# Patient Record
Sex: Female | Born: 1980 | Race: White | Hispanic: No | Marital: Married | Smoking: Former smoker
Health system: Southern US, Community
[De-identification: ages and names within clinical notes are randomized; demographics above are authoritative.]

## PROBLEM LIST (undated history)

## (undated) DIAGNOSIS — Z87442 Personal history of urinary calculi: Secondary | ICD-10-CM

## (undated) DIAGNOSIS — Z9889 Other specified postprocedural states: Secondary | ICD-10-CM

## (undated) DIAGNOSIS — E039 Hypothyroidism, unspecified: Secondary | ICD-10-CM

## (undated) DIAGNOSIS — F988 Other specified behavioral and emotional disorders with onset usually occurring in childhood and adolescence: Secondary | ICD-10-CM

## (undated) DIAGNOSIS — R112 Nausea with vomiting, unspecified: Secondary | ICD-10-CM

## (undated) HISTORY — PX: TONSILLECTOMY: SUR1361

## (undated) HISTORY — PX: OTHER SURGICAL HISTORY: SHX169

---

## 2001-02-26 ENCOUNTER — Other Ambulatory Visit: Admission: RE | Admit: 2001-02-26 | Discharge: 2001-02-26 | Payer: Self-pay | Admitting: Obstetrics and Gynecology

## 2003-05-23 ENCOUNTER — Other Ambulatory Visit: Admission: RE | Admit: 2003-05-23 | Discharge: 2003-05-23 | Payer: Self-pay | Admitting: Obstetrics and Gynecology

## 2004-05-24 ENCOUNTER — Other Ambulatory Visit: Admission: RE | Admit: 2004-05-24 | Discharge: 2004-05-24 | Payer: Self-pay | Admitting: Obstetrics and Gynecology

## 2004-08-13 ENCOUNTER — Ambulatory Visit (HOSPITAL_COMMUNITY): Admission: RE | Admit: 2004-08-13 | Discharge: 2004-08-13 | Payer: Self-pay | Admitting: Internal Medicine

## 2005-02-22 ENCOUNTER — Inpatient Hospital Stay (HOSPITAL_COMMUNITY): Admission: AD | Admit: 2005-02-22 | Discharge: 2005-02-24 | Payer: Self-pay | Admitting: Obstetrics and Gynecology

## 2006-12-12 ENCOUNTER — Inpatient Hospital Stay (HOSPITAL_COMMUNITY): Admission: AD | Admit: 2006-12-12 | Discharge: 2006-12-12 | Payer: Self-pay | Admitting: Obstetrics and Gynecology

## 2007-05-02 ENCOUNTER — Inpatient Hospital Stay (HOSPITAL_COMMUNITY): Admission: AD | Admit: 2007-05-02 | Discharge: 2007-05-02 | Payer: Self-pay | Admitting: Obstetrics and Gynecology

## 2007-07-06 ENCOUNTER — Observation Stay (HOSPITAL_COMMUNITY): Admission: AD | Admit: 2007-07-06 | Discharge: 2007-07-08 | Payer: Self-pay | Admitting: Obstetrics and Gynecology

## 2007-07-06 ENCOUNTER — Inpatient Hospital Stay (HOSPITAL_COMMUNITY): Admission: AD | Admit: 2007-07-06 | Discharge: 2007-07-06 | Payer: Self-pay | Admitting: Obstetrics and Gynecology

## 2007-07-07 ENCOUNTER — Encounter: Payer: Self-pay | Admitting: Obstetrics & Gynecology

## 2007-07-29 ENCOUNTER — Inpatient Hospital Stay (HOSPITAL_COMMUNITY): Admission: AD | Admit: 2007-07-29 | Discharge: 2007-07-31 | Payer: Self-pay | Admitting: Obstetrics and Gynecology

## 2008-02-25 ENCOUNTER — Encounter: Admission: RE | Admit: 2008-02-25 | Discharge: 2008-02-25 | Payer: Self-pay | Admitting: Family Medicine

## 2008-10-21 ENCOUNTER — Ambulatory Visit (HOSPITAL_COMMUNITY): Admission: RE | Admit: 2008-10-21 | Discharge: 2008-10-21 | Payer: Self-pay | Admitting: Internal Medicine

## 2009-09-13 ENCOUNTER — Encounter: Admission: RE | Admit: 2009-09-13 | Discharge: 2009-09-13 | Payer: Self-pay | Admitting: Internal Medicine

## 2009-10-24 IMAGING — US US PELVIS COMPLETE
1 series · 14 of 25 positions shown · non-contrast
Comparison: none

OBSTETRICAL ULTRASOUND:

 This ultrasound exam was performed in the [HOSPITAL] Ultrasound Department.  The OB US report was generated in the AS system, and faxed to the ordering physician.  This report is also available in [REDACTED] PACS.

[Series 1: us pelvis complete · 0.33mm/px · 14 of 53 slices shown]
[im 1/53]
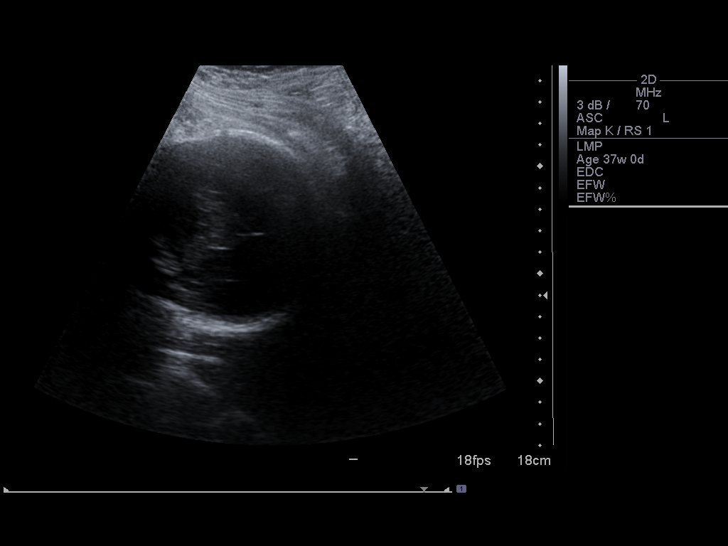
[im 5/53]
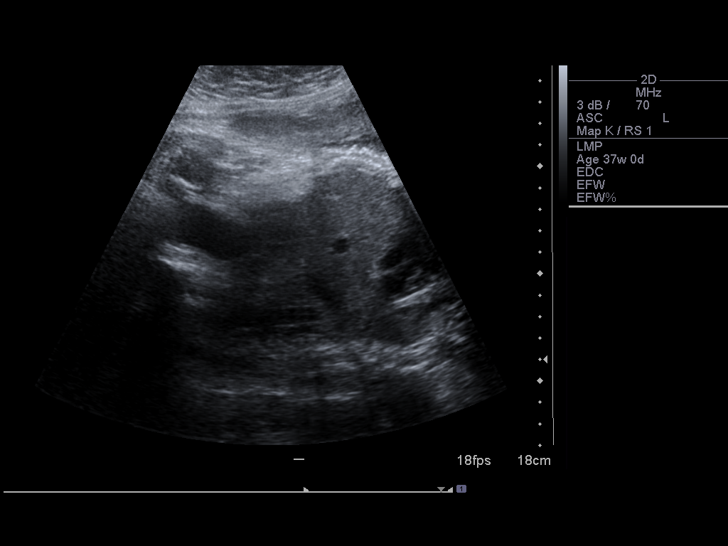
[im 9/53]
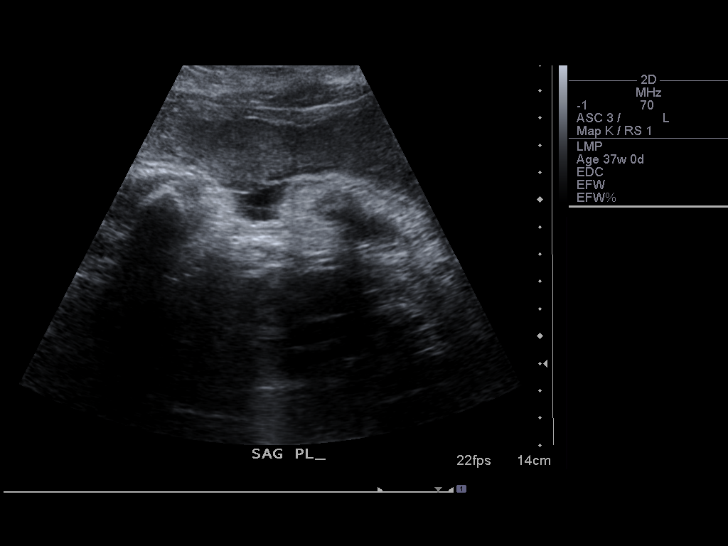
[im 14/53]
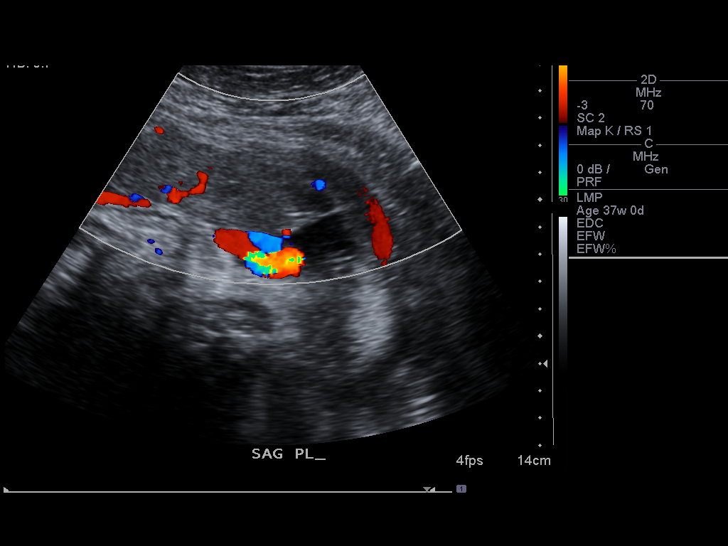
[im 18/53]
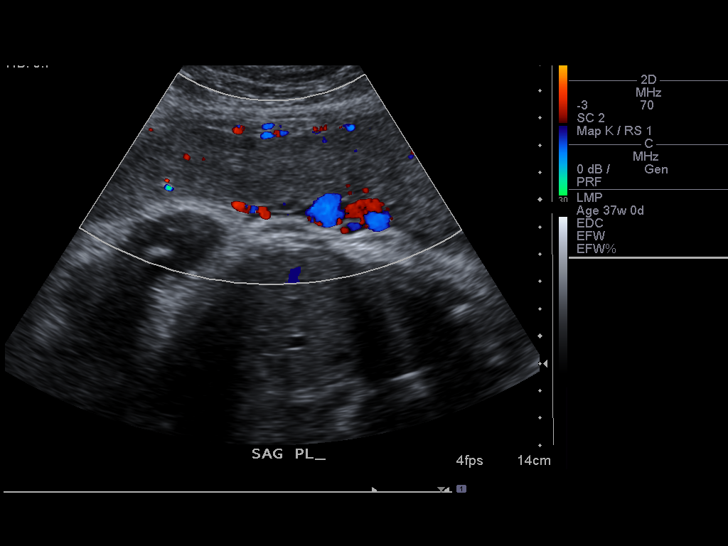
[im 20/53]
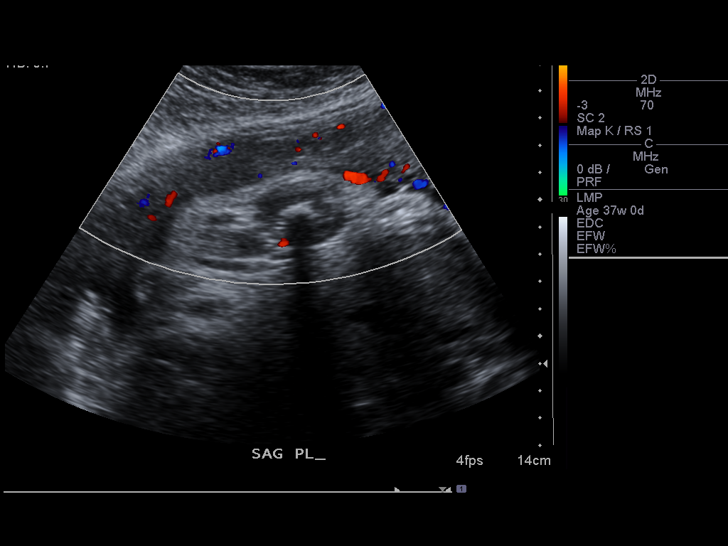
[im 24/53]
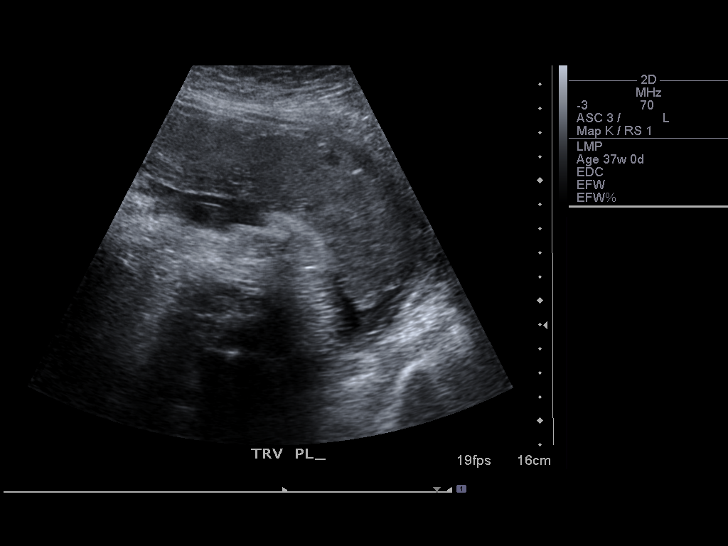
[im 29/53]
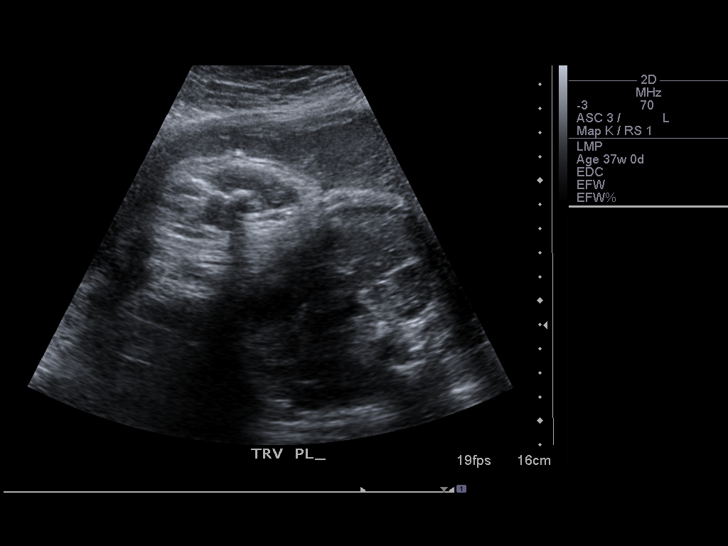
[im 33/53]
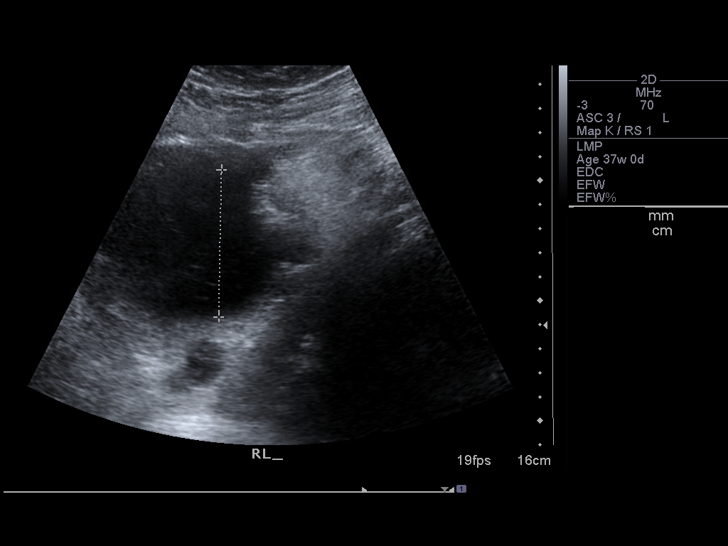
[im 35/53]
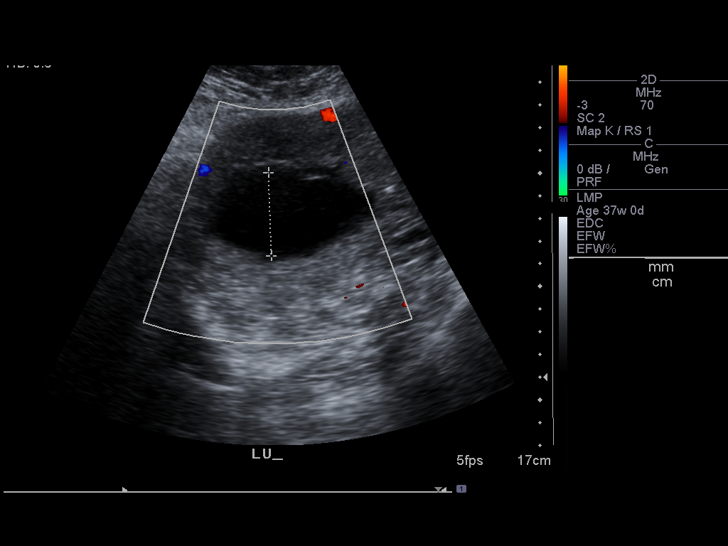
[im 40/53]
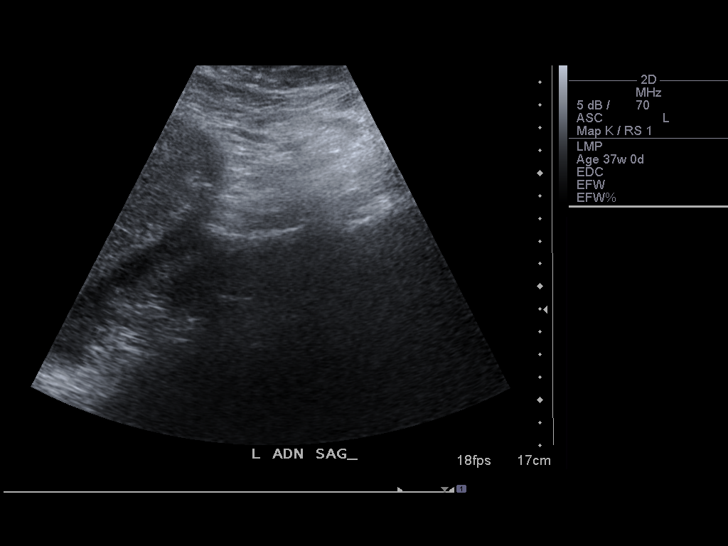
[im 44/53]
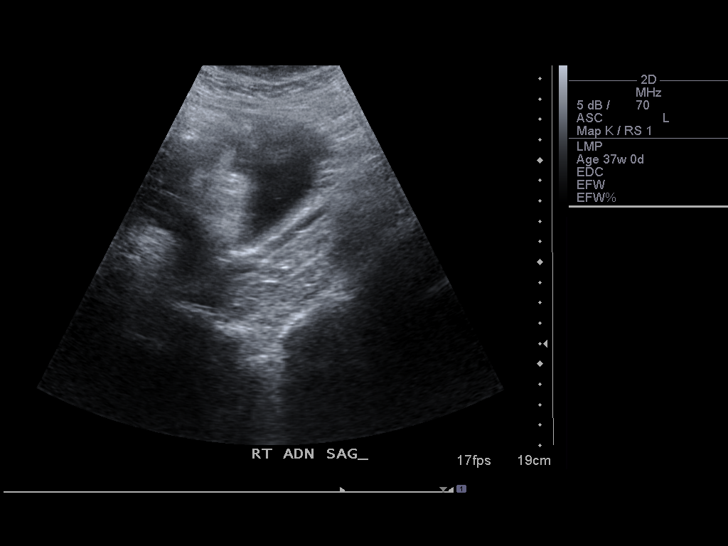
[im 48/53]
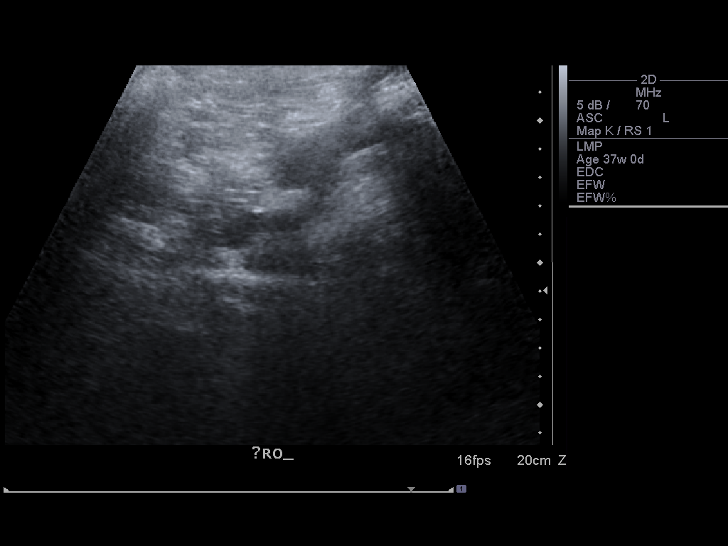
[im 53/53]
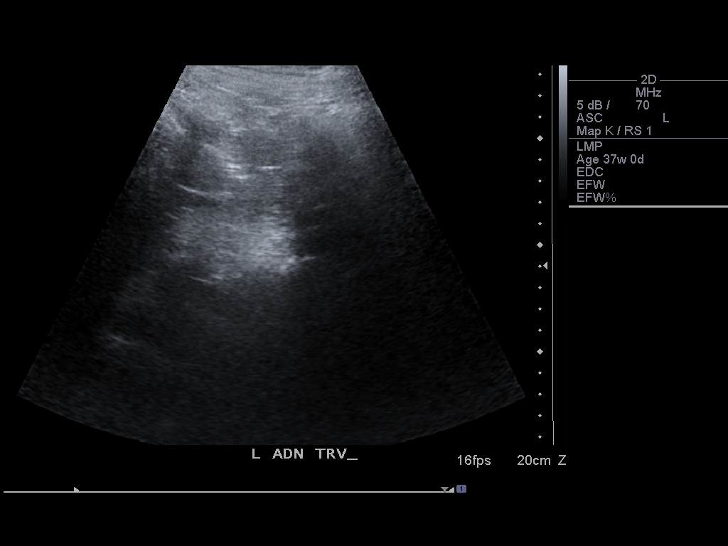

[14 of 25 positions shown; findings below may reference images not displayed]

IMPRESSION: See AS Obstetric US report.

## 2010-05-28 ENCOUNTER — Encounter: Payer: Self-pay | Admitting: Internal Medicine

## 2010-09-18 NOTE — Discharge Summary (Signed)
Carolyn Church, Carolyn Church           ACCOUNT NO.:  [id number]   MEDICAL RECORD NO.:  [id number]          PATIENT TYPE:  OBV   LOCATION:  9158                          FACILITY:  [facility]   PHYSICIAN:  Randye Lobo, M.D.   DATE OF BIRTH:  07/02/1980   DATE OF ADMISSION:  07/06/2007  DATE OF DISCHARGE:  07/08/2007                               DISCHARGE SUMMARY   FINAL DIAGNOSES:  1. Intrauterine pregnancy at 37 weeks' gestation.  2. Right flank lower quadrant pain.  3. Nephrolithiasis.   COMPLICATIONS:  None.   HISTORY:  This 30 year old G2, P1-0-0-1 presents at 70 weeks' gestation  with an onset of cramping with right flank pain.  The patient had been  seen in the hospital the day prior with diagnosis of musculoskeletal  pain and sent home.  Pain intensified on over the night, and the patient  was admitted.  She had an ultrasound that only showed some mild right  hydronephrosis and normal urinalysis.  The patient has not had any  history of kidney stones before.  She was not contracting or any signs  of labor were evident.  She was admitted for observation and for pain  control.  The patient needed morphine over the night for her pain  control.  The patient did have some mild leukocytosis, therefore an MRI  was ordered to rule out any type of appendicitis or infectious process.  There was no active pathology identified on the MRI, and no signs of any  pregnancy problems as well.  White blood cell count started to return to  normal, and the patient was felt ready for discharge on hospital day #3.  It sounds like that she did pass some stones the night prior, she was  feeling good, good fetal movement, and was felt ready for discharge.  She was sent home on oral hydration and slowly evolve her diet, Vicodin  for her pain control, and Phenergan to be used for her nausea as needed  25 mg.   FOLLOWUP:  She is to follow up in our office that next week.  Of course,  to call with any  increased pain or her signs and symptoms returning.  Instructions and precautions were reviewed with the patient.   LABS ON DISCHARGE:  She had a hemoglobin 9.4, white blood cell count of  12.8 which was down from a high of 16.6, and platelets are 368,000.      Leilani Able, P.A.-C.      Randye Lobo, M.D.  Electronically Signed    MB/MEDQ  D:  07/29/2007  T:  07/30/2007  Job:  [id number]

## 2010-10-18 ENCOUNTER — Other Ambulatory Visit (HOSPITAL_COMMUNITY): Payer: Self-pay | Admitting: Gastroenterology

## 2010-10-18 DIAGNOSIS — R110 Nausea: Secondary | ICD-10-CM

## 2010-11-13 ENCOUNTER — Ambulatory Visit (HOSPITAL_COMMUNITY)
Admission: RE | Admit: 2010-11-13 | Discharge: 2010-11-13 | Disposition: A | Discharge status: Home / Self Care | Payer: BC Managed Care – PPO | Source: Ambulatory Visit | Attending: Gastroenterology | Admitting: Gastroenterology

## 2010-11-13 ENCOUNTER — Encounter (HOSPITAL_COMMUNITY)
Admission: RE | Admit: 2010-11-13 | Discharge: 2010-11-13 | Disposition: A | Discharge status: Home / Self Care | Payer: BC Managed Care – PPO | Source: Ambulatory Visit | Attending: Gastroenterology | Admitting: Gastroenterology

## 2010-11-13 ENCOUNTER — Encounter (HOSPITAL_COMMUNITY): Payer: Self-pay

## 2010-11-13 DIAGNOSIS — R110 Nausea: Secondary | ICD-10-CM

## 2010-11-13 DIAGNOSIS — R1011 Right upper quadrant pain: Secondary | ICD-10-CM | POA: Insufficient documentation

## 2010-11-13 DIAGNOSIS — R109 Unspecified abdominal pain: Secondary | ICD-10-CM | POA: Insufficient documentation

## 2010-11-13 MED ORDER — TECHNETIUM TC 99M MEBROFENIN IV KIT
5.2000 | PACK | Freq: Once | INTRAVENOUS | Status: AC | PRN
  Administered 2010-11-13: 5 via INTRAVENOUS

## 2011-01-28 LAB — CBC
HCT: 27.8 — ABNORMAL LOW
HCT: 28.3 — ABNORMAL LOW
HCT: 24.7 — ABNORMAL LOW
HCT: 29.5 — ABNORMAL LOW
Hemoglobin: 9.4 — ABNORMAL LOW
Hemoglobin: 9.6 — ABNORMAL LOW
Hemoglobin: 8.4 — ABNORMAL LOW
Hemoglobin: 9.7 — ABNORMAL LOW
MCHC: 33.9
MCHC: 34.1
MCHC: 33
MCHC: 33.8
MCV: 81.8
MCV: 81.9
MCV: 78.6
MCV: 79.7
Platelets: 360
Platelets: 390
Platelets: 316
Platelets: 338
RBC: 3.39 — ABNORMAL LOW
RBC: 3.46 — ABNORMAL LOW
RBC: 3.15 — ABNORMAL LOW
RBC: 3.71 — ABNORMAL LOW
RDW: 14.6
RDW: 15.1
RDW: 15.9 — ABNORMAL HIGH
RDW: 16 — ABNORMAL HIGH
WBC: 12.8 — ABNORMAL HIGH
WBC: 16.6 — ABNORMAL HIGH
WBC: 10.9 — ABNORMAL HIGH
WBC: 13.1 — ABNORMAL HIGH

## 2011-01-28 LAB — URINALYSIS, ROUTINE W REFLEX MICROSCOPIC
Glucose, UA: NEGATIVE
Ketones, ur: 15 — AB
Leukocytes, UA: NEGATIVE
Nitrite: NEGATIVE
Protein, ur: 30 — AB
Specific Gravity, Urine: >1.03 — ABNORMAL HIGH
Urobilinogen, UA: 0.2
pH: 5.5

## 2011-01-28 LAB — URINE MICROSCOPIC-ADD ON

## 2011-01-28 LAB — RPR: RPR Ser Ql: NONREACTIVE

## 2011-02-18 LAB — URINALYSIS, ROUTINE W REFLEX MICROSCOPIC
Bilirubin Urine: NEGATIVE
Glucose, UA: NEGATIVE
Ketones, ur: NEGATIVE
Nitrite: NEGATIVE
Protein, ur: NEGATIVE
Specific Gravity, Urine: 1.025
Urobilinogen, UA: 0.2
pH: 6

## 2011-02-18 LAB — URINE MICROSCOPIC-ADD ON

## 2011-02-18 LAB — POCT PREGNANCY, URINE
Operator id: 140111
Preg Test, Ur: POSITIVE

## 2011-10-28 ENCOUNTER — Encounter: Payer: Self-pay | Admitting: Family Medicine

## 2011-10-28 ENCOUNTER — Ambulatory Visit: Payer: BC Managed Care – PPO

## 2011-10-28 ENCOUNTER — Ambulatory Visit (INDEPENDENT_AMBULATORY_CARE_PROVIDER_SITE_OTHER): Payer: BC Managed Care – PPO | Admitting: Family Medicine

## 2011-10-28 VITALS — BP 118/79 | HR 96 | Temp 98.2°F | Resp 18 | Ht 63.0 in | Wt 153.0 lb

## 2011-10-28 DIAGNOSIS — M79609 Pain in unspecified limb: Secondary | ICD-10-CM

## 2011-10-28 DIAGNOSIS — M79641 Pain in right hand: Secondary | ICD-10-CM

## 2011-10-28 MED ORDER — MELOXICAM 7.5 MG PO TABS: 7.5000 mg | ORAL_TABLET | Freq: Every day | ORAL | Status: AC

## 2011-10-28 NOTE — Progress Notes (Signed)
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  She F.O[employer] Today when dog ran under her.  Pain and swelling have persisted since 8:30 am today.  Took ibuprofen, pain worsens when extending fingers.  O:  NAD Right hand very slightly swollen in thenar area with tenderness in long finger metacarpal and thenar area. Decrease ROM secondary to pain  [facility] reading (PRIMARY) by  Dr. Milus Glazier, R hand: neg  A:  Hand contusion, sprain  P: splint meloxicam

## 2013-03-02 IMAGING — NM NM HEPATO W/GB/PHARM/[PERSON_NAME]
2 series · 12 of 12 positions shown · non-contrast
Comparison: None.

CLINICAL DATA: Abdominal pain

NUCLEAR MEDICINE HEPATOBILIARY IMAGING WITH GALLBLADDER EF
TECHNIQUE: Sequential images of the abdomen were obtained [DATE] minutes following intravenous administration of
radiopharmaceutical.  After slow intravenous infusion of
micrograms Cholecystokinin, gallbladder ejection fraction was
determined.
Radiopharmaceutical:  5.2 mCi Lc-IIm Choletec

[Series 1: he hepato · 4.75mm/px · 6 of 60 frames shown (1 of 2)]
[frame 6/60]
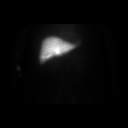
[frame 16/60]
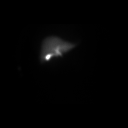
[frame 26/60]
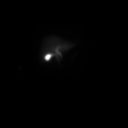
[frame 36/60]
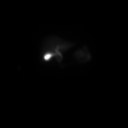
[frame 46/60]
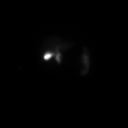
[frame 56/60]
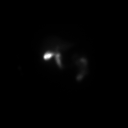

[Series 1: he hepato · 4.75mm/px · 6 of 30 frames shown (2 of 2)]
[frame 3/30]
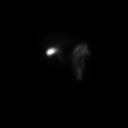
[frame 8/30]
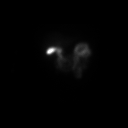
[frame 13/30]
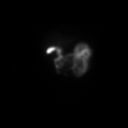
[frame 18/30]
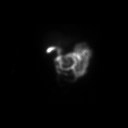
[frame 23/30]
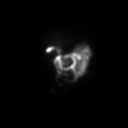
[frame 28/30]
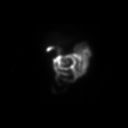

[12 of 12 positions shown; findings below may reference images not displayed]

FINDINGS: Gallbladder activity occurs at 5 minutes.  Small bowel
activity occurs at 25 minutes.  Gallbladder ejection fraction is
82% at 28 minutes.

The patient did not experience symptoms during CCK infusion.
IMPRESSION: Cystic and common bile ducts are patent.  Gallbladder ejection
fraction is within normal limits.

## 2014-02-14 IMAGING — CR DG HAND COMPLETE 3+V*R*
2 series · 2 of 2 positions shown · non-contrast
Comparison: None.

CLINICAL DATA: Fall onto outstretched hand.  Pain.

RIGHT HAND - COMPLETE 3+ VIEW

[PA]
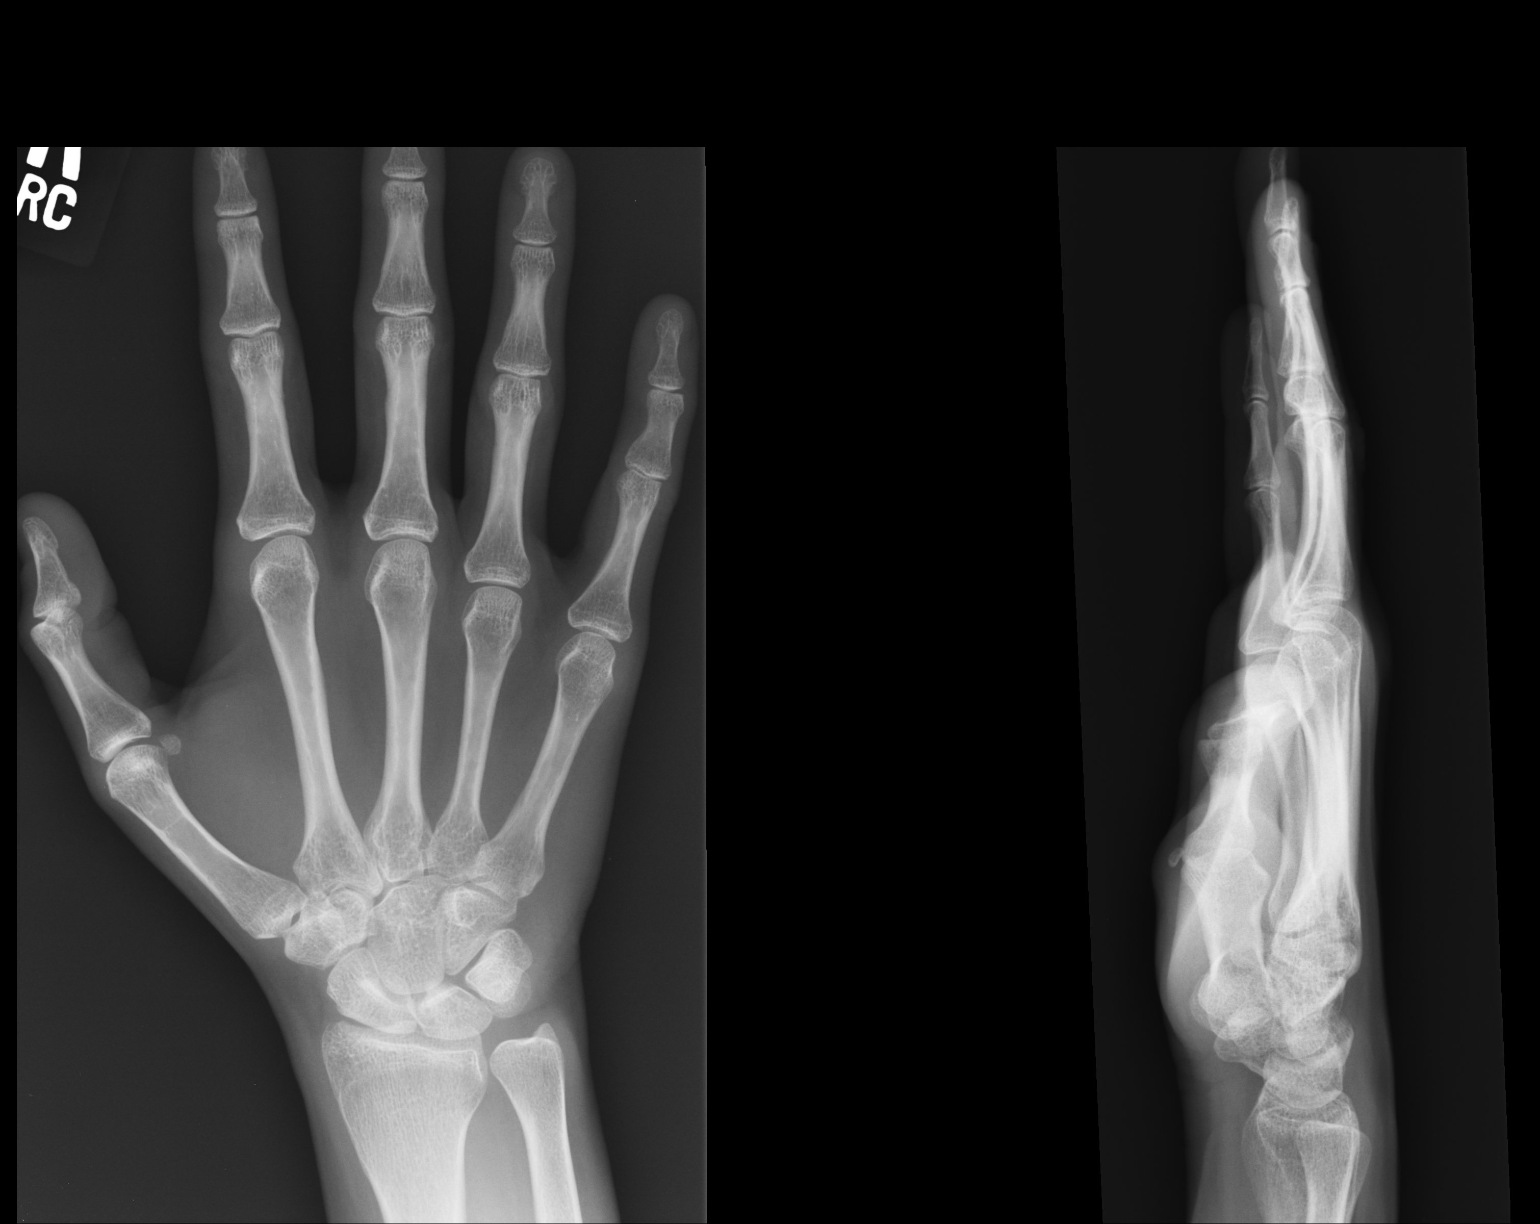

[pa obl]
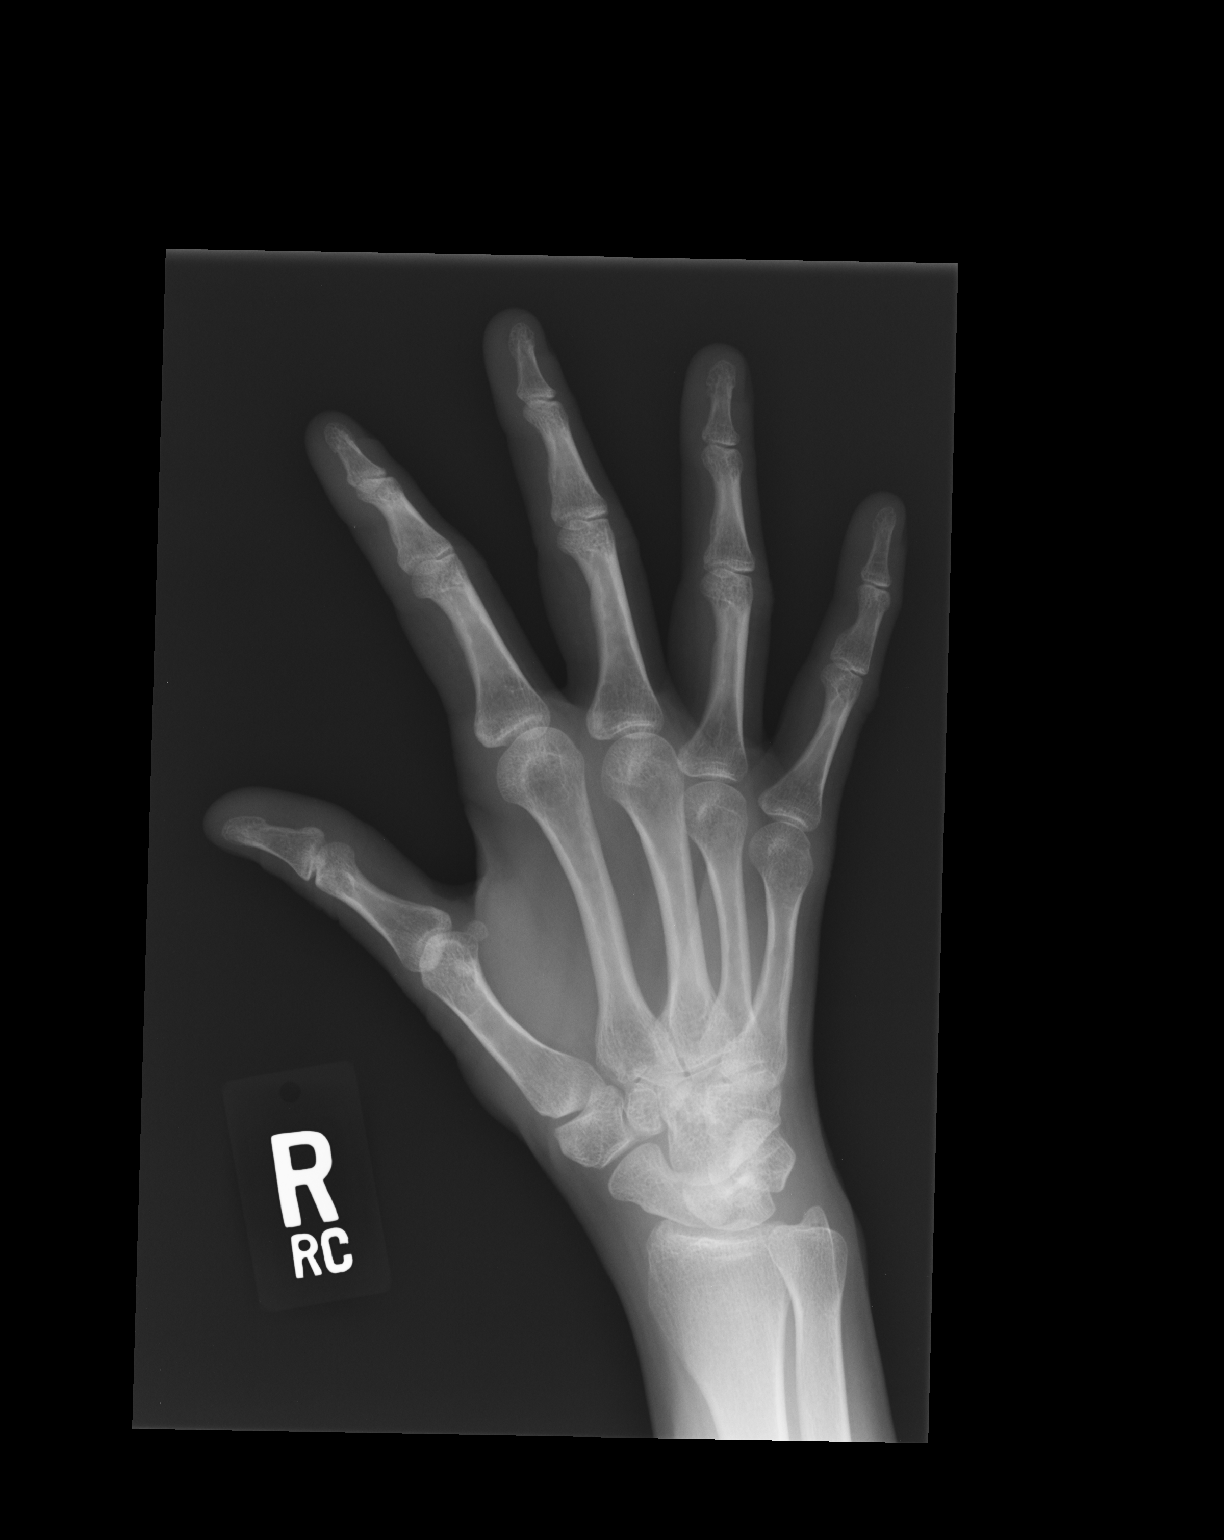

[2 of 2 positions shown; findings below may reference images not displayed]

FINDINGS: No acute osseous or joint abnormality.
IMPRESSION: No acute osseous or joint abnormality.

Clinically significant discrepancy from primary report, if
provided: None

## 2017-09-25 ENCOUNTER — Other Ambulatory Visit: Payer: Self-pay | Admitting: Orthopedic Surgery

## 2017-10-08 ENCOUNTER — Encounter (HOSPITAL_BASED_OUTPATIENT_CLINIC_OR_DEPARTMENT_OTHER): Payer: Self-pay | Admitting: *Deleted

## 2017-10-08 ENCOUNTER — Other Ambulatory Visit: Payer: Self-pay

## 2017-10-14 DIAGNOSIS — S83281A Other tear of lateral meniscus, current injury, right knee, initial encounter: Secondary | ICD-10-CM | POA: Diagnosis present

## 2017-10-14 NOTE — H&P (Signed)
Carolyn Church is an 37 y.o. female.   Chief Complaint: Right Knee Pain  HPI: Patient presents with a chief complaint of right knee pain.  The patient states that 4 weeks ago she fell getting out of a shower and dislocated her patella.  She was seen elsewhere where she was treated with oral medications, crutches, and a knee brace/immobilizer.  Patient eventually saw an orthopedist and an MRI was ordered.  Today, she states the pain is constant and severe sharp dull pain.  She has noticed swelling and weakness.  It does wake her from sleep.  The patient states that she has intermittent locking in the knee and will catch and cause severe pain.  Worse with activity and better with rest.  She denies any fevers chills night sweats or other signs of infection.  Past Medical History:  Diagnosis Date  . Abdominal pain   . ADD (attention deficit disorder)   . History of kidney stones    2009 with pregnancy  . Hypothyroidism   . PONV (postoperative nausea and vomiting)     Past Surgical History:  Procedure Laterality Date  . Bilateral breast reduction    . BMT     as infant  . TONSILLECTOMY    . wisdom teeth extractions      History reviewed. No pertinent family history. Social History:  reports that she has quit smoking. She has never used smokeless tobacco. She reports that she does not drink alcohol or use drugs.  Allergies:  Allergies  Allergen Reactions  . Sulfa Antibiotics Shortness Of Breath    Respiratory issues.  . Vicodin [Hydrocodone-Acetaminophen] Itching    No medications prior to admission.    No results found for this or any previous visit (from the past 48 hour(s)). No results found.  Review of Systems  Constitutional:       Weight changes  HENT: Negative.   Eyes: Negative.   Respiratory: Negative.   Cardiovascular: Negative.   Gastrointestinal: Negative.   Genitourinary: Negative.   Musculoskeletal: Positive for joint pain.  Skin: Negative.    Neurological: Negative.   Endo/Heme/Allergies: Negative.   Psychiatric/Behavioral: Negative.     Height 5\' 3"  (1.6 m), weight 86.2 kg (190 lb), last menstrual period 09/29/2017. Physical Exam  Constitutional: She is oriented to person, place, and time. She appears well-developed and well-nourished.  HENT:  Head: Normocephalic and atraumatic.  Eyes: Pupils are equal, round, and reactive to light.  Neck: Normal range of motion. Neck supple.  Cardiovascular: Intact distal pulses.  Respiratory: Effort normal.  Musculoskeletal: She exhibits tenderness.  the patient has good strength and good range of motion in the left knee.  Patient's right knee does have reduced range of motion from 0-100.  Tenderness over the anterior lateral joint line and over the lateral femoral condyle region.  Mild swelling.  No erythema or warmth.  No instability with valgus or varus stress.  Heel bump does cause increased pain.  Increased pain with McMurray's test.  Her calves are soft and nontender.  She is neurovascularly intact distally.  Neurological: She is alert and oriented to person, place, and time.  Skin: Skin is warm and dry.  Psychiatric: She has a normal mood and affect. Her behavior is normal. Judgment and thought content normal.     Assessment/Plan  Assess: Right knee injury with history of patella dislocation resulting in nondisplaced impaction fracture of the anterior lateral femoral condyle and undersurface tear of the anterior horn of lateral meniscus  Intermittent locking with concerns for a chondral loose body in a patient who cannot currently work due to her symptoms  Plan: Treatment options are discussed with the patient.  This patient is also discussed with Dr. Turner Daniels who also examined the patient.  This patient has elected to proceed with a right knee arthroscopy.  Again we do feel there is a loose body causing her mechanical symptoms.  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  Continue with rest ice and over-the-counter medications for relief.  Patient is placed in a patella keeper brace for comfort.  This is a different brace than the knee immobilizer brace that she had.  The benefits risks and potential competitions of surgery are discussed.     Dannielle Burn, PA-C 10/14/2017, 8:02 AM

## 2017-10-15 ENCOUNTER — Ambulatory Visit (HOSPITAL_BASED_OUTPATIENT_CLINIC_OR_DEPARTMENT_OTHER): Payer: BLUE CROSS/BLUE SHIELD | Admitting: Certified Registered"

## 2017-10-15 ENCOUNTER — Other Ambulatory Visit: Payer: Self-pay

## 2017-10-15 ENCOUNTER — Encounter (HOSPITAL_BASED_OUTPATIENT_CLINIC_OR_DEPARTMENT_OTHER): Payer: Self-pay | Admitting: *Deleted

## 2017-10-15 ENCOUNTER — Encounter (HOSPITAL_BASED_OUTPATIENT_CLINIC_OR_DEPARTMENT_OTHER)
Admission: RE | Disposition: A | Discharge status: Home / Self Care | Payer: Self-pay | Source: Ambulatory Visit | Attending: Orthopedic Surgery

## 2017-10-15 ENCOUNTER — Ambulatory Visit (HOSPITAL_BASED_OUTPATIENT_CLINIC_OR_DEPARTMENT_OTHER)
Admission: RE | Admit: 2017-10-15 | Discharge: 2017-10-15 | Disposition: A | Discharge status: Home / Self Care | Payer: BLUE CROSS/BLUE SHIELD | Source: Ambulatory Visit | Attending: Orthopedic Surgery | Admitting: Orthopedic Surgery

## 2017-10-15 DIAGNOSIS — M2341 Loose body in knee, right knee: Secondary | ICD-10-CM | POA: Insufficient documentation

## 2017-10-15 DIAGNOSIS — M25561 Pain in right knee: Secondary | ICD-10-CM | POA: Diagnosis present

## 2017-10-15 DIAGNOSIS — M94261 Chondromalacia, right knee: Secondary | ICD-10-CM | POA: Diagnosis not present

## 2017-10-15 DIAGNOSIS — Z87891 Personal history of nicotine dependence: Secondary | ICD-10-CM | POA: Insufficient documentation

## 2017-10-15 DIAGNOSIS — M23303 Other meniscus derangements, unspecified medial meniscus, right knee: Secondary | ICD-10-CM | POA: Diagnosis not present

## 2017-10-15 DIAGNOSIS — S83281A Other tear of lateral meniscus, current injury, right knee, initial encounter: Secondary | ICD-10-CM

## 2017-10-15 HISTORY — PX: KNEE ARTHROSCOPY: SHX127

## 2017-10-15 HISTORY — DX: Other specified behavioral and emotional disorders with onset usually occurring in childhood and adolescence: F98.8

## 2017-10-15 HISTORY — DX: Personal history of urinary calculi: Z87.442

## 2017-10-15 HISTORY — DX: Nausea with vomiting, unspecified: Z98.890

## 2017-10-15 HISTORY — DX: Nausea with vomiting, unspecified: R11.2

## 2017-10-15 HISTORY — DX: Hypothyroidism, unspecified: E03.9

## 2017-10-15 LAB — POCT PREGNANCY, URINE: Preg Test, Ur: NEGATIVE

## 2017-10-15 SURGERY — ARTHROSCOPY, KNEE
Anesthesia: General | Site: Knee | Laterality: Right

## 2017-10-15 MED ORDER — OXYCODONE-ACETAMINOPHEN 5-325 MG PO TABS: 1.0000 | ORAL_TABLET | Freq: Four times a day (QID) | ORAL | 0 refills | Status: AC | PRN

## 2017-10-15 MED ORDER — LACTATED RINGERS IV SOLN: INTRAVENOUS | Status: DC

## 2017-10-15 MED ORDER — LIDOCAINE HCL (CARDIAC) PF 100 MG/5ML IV SOSY
PREFILLED_SYRINGE | INTRAVENOUS | Status: AC
  Filled 2017-10-15: qty 5

## 2017-10-15 MED ORDER — METOCLOPRAMIDE HCL 5 MG PO TABS: 5.0000 mg | ORAL_TABLET | Freq: Three times a day (TID) | ORAL | Status: DC | PRN

## 2017-10-15 MED ORDER — MIDAZOLAM HCL 2 MG/2ML IJ SOLN
INTRAMUSCULAR | Status: AC
  Filled 2017-10-15: qty 2

## 2017-10-15 MED ORDER — DEXAMETHASONE SODIUM PHOSPHATE 10 MG/ML IJ SOLN
INTRAMUSCULAR | Status: DC | PRN
  Administered 2017-10-15: 10 mg via INTRAVENOUS

## 2017-10-15 MED ORDER — CHLORHEXIDINE GLUCONATE 4 % EX LIQD: 60.0000 mL | Freq: Once | CUTANEOUS | Status: DC

## 2017-10-15 MED ORDER — FENTANYL CITRATE (PF) 100 MCG/2ML IJ SOLN
25.0000 ug | INTRAMUSCULAR | Status: DC | PRN
  Administered 2017-10-15 (×2): 50 ug via INTRAVENOUS

## 2017-10-15 MED ORDER — CEFAZOLIN SODIUM-DEXTROSE 2-3 GM-%(50ML) IV SOLR
INTRAVENOUS | Status: DC | PRN
  Administered 2017-10-15: 2 g via INTRAVENOUS

## 2017-10-15 MED ORDER — CEFAZOLIN SODIUM-DEXTROSE 2-4 GM/100ML-% IV SOLN
INTRAVENOUS | Status: AC
  Filled 2017-10-15: qty 100

## 2017-10-15 MED ORDER — FENTANYL CITRATE (PF) 100 MCG/2ML IJ SOLN
INTRAMUSCULAR | Status: AC
  Filled 2017-10-15: qty 2

## 2017-10-15 MED ORDER — ONDANSETRON HCL 4 MG/2ML IJ SOLN
INTRAMUSCULAR | Status: AC
  Filled 2017-10-15: qty 2

## 2017-10-15 MED ORDER — EPINEPHRINE 30 MG/30ML IJ SOLN
INTRAMUSCULAR | Status: AC
  Filled 2017-10-15: qty 1

## 2017-10-15 MED ORDER — BUPIVACAINE-EPINEPHRINE 0.5% -1:200000 IJ SOLN
INTRAMUSCULAR | Status: DC | PRN
  Administered 2017-10-15: 20 mL

## 2017-10-15 MED ORDER — SCOPOLAMINE 1 MG/3DAYS TD PT72: 1.0000 | MEDICATED_PATCH | Freq: Once | TRANSDERMAL | Status: DC | PRN

## 2017-10-15 MED ORDER — FENTANYL CITRATE (PF) 100 MCG/2ML IJ SOLN
50.0000 ug | INTRAMUSCULAR | Status: DC | PRN
  Administered 2017-10-15: 100 ug via INTRAVENOUS

## 2017-10-15 MED ORDER — FENTANYL CITRATE (PF) 100 MCG/2ML IJ SOLN
INTRAMUSCULAR | Status: AC
Start: 2017-10-15 — End: ?
  Filled 2017-10-15: qty 2

## 2017-10-15 MED ORDER — BUPIVACAINE-EPINEPHRINE (PF) 0.5% -1:200000 IJ SOLN
INTRAMUSCULAR | Status: AC
  Filled 2017-10-15: qty 30

## 2017-10-15 MED ORDER — CEFAZOLIN SODIUM-DEXTROSE 2-4 GM/100ML-% IV SOLN: 2.0000 g | INTRAVENOUS | Status: DC

## 2017-10-15 MED ORDER — LACTATED RINGERS IV SOLN
INTRAVENOUS | Status: DC
  Administered 2017-10-15: 11:00:00 via INTRAVENOUS

## 2017-10-15 MED ORDER — PROPOFOL 10 MG/ML IV BOLUS
INTRAVENOUS | Status: AC
  Filled 2017-10-15: qty 20

## 2017-10-15 MED ORDER — ONDANSETRON HCL 4 MG PO TABS: 4.0000 mg | ORAL_TABLET | Freq: Four times a day (QID) | ORAL | Status: DC | PRN

## 2017-10-15 MED ORDER — ONDANSETRON HCL 4 MG/2ML IJ SOLN
INTRAMUSCULAR | Status: DC | PRN
  Administered 2017-10-15: 4 mg via INTRAVENOUS

## 2017-10-15 MED ORDER — MIDAZOLAM HCL 2 MG/2ML IJ SOLN
1.0000 mg | INTRAMUSCULAR | Status: DC | PRN
  Administered 2017-10-15: 2 mg via INTRAVENOUS

## 2017-10-15 MED ORDER — DEXAMETHASONE SODIUM PHOSPHATE 10 MG/ML IJ SOLN
INTRAMUSCULAR | Status: AC
  Filled 2017-10-15: qty 1

## 2017-10-15 MED ORDER — SODIUM CHLORIDE 0.9 % IR SOLN
Status: DC | PRN
  Administered 2017-10-15: 13:00:00

## 2017-10-15 MED ORDER — LIDOCAINE HCL (CARDIAC) PF 100 MG/5ML IV SOSY
PREFILLED_SYRINGE | INTRAVENOUS | Status: DC | PRN
  Administered 2017-10-15: 80 mg via INTRAVENOUS

## 2017-10-15 MED ORDER — METOCLOPRAMIDE HCL 5 MG/ML IJ SOLN: 5.0000 mg | Freq: Three times a day (TID) | INTRAMUSCULAR | Status: DC | PRN

## 2017-10-15 MED ORDER — ONDANSETRON HCL 4 MG/2ML IJ SOLN: 4.0000 mg | Freq: Four times a day (QID) | INTRAMUSCULAR | Status: DC | PRN

## 2017-10-15 SURGICAL SUPPLY — 40 items
BANDAGE ACE 6X5 VEL STRL LF (GAUZE/BANDAGES/DRESSINGS) ×2 IMPLANT
BLADE 4.2CUDA (BLADE) IMPLANT
BLADE CUTTER GATOR 3.5 (BLADE) ×2 IMPLANT
BLADE GREAT WHITE 4.2 (BLADE) IMPLANT
BNDG COHESIVE 6X5 TAN STRL LF (GAUZE/BANDAGES/DRESSINGS) ×2 IMPLANT
DRAPE ARTHROSCOPY W/POUCH 90 (DRAPES) ×2 IMPLANT
DURAPREP 26ML APPLICATOR (WOUND CARE) ×2 IMPLANT
ELECT MENISCUS 165MM 90D (ELECTRODE) IMPLANT
ELECT REM PT RETURN 9FT ADLT (ELECTROSURGICAL) ×2
ELECTRODE REM PT RTRN 9FT ADLT (ELECTROSURGICAL) ×1 IMPLANT
GAUZE SPONGE 4X4 12PLY STRL (GAUZE/BANDAGES/DRESSINGS) ×2 IMPLANT
GAUZE XEROFORM 1X8 LF (GAUZE/BANDAGES/DRESSINGS) ×2 IMPLANT
GLOVE BIO SURGEON STRL SZ7.5 (GLOVE) ×2 IMPLANT
GLOVE BIO SURGEON STRL SZ8.5 (GLOVE) ×2 IMPLANT
GLOVE BIOGEL PI IND STRL 6.5 (GLOVE) ×1 IMPLANT
GLOVE BIOGEL PI IND STRL 8 (GLOVE) ×1 IMPLANT
GLOVE BIOGEL PI IND STRL 9 (GLOVE) ×1 IMPLANT
GLOVE BIOGEL PI INDICATOR 6.5 (GLOVE) ×1
GLOVE BIOGEL PI INDICATOR 8 (GLOVE) ×1
GLOVE BIOGEL PI INDICATOR 9 (GLOVE) ×1
GLOVE SURG SS PI 7.0 STRL IVOR (GLOVE) ×2 IMPLANT
GOWN STRL REUS W/ TWL LRG LVL3 (GOWN DISPOSABLE) ×2 IMPLANT
GOWN STRL REUS W/TWL LRG LVL3 (GOWN DISPOSABLE) ×2
GOWN STRL REUS W/TWL XL LVL3 (GOWN DISPOSABLE) ×2 IMPLANT
IV NS IRRIG 3000ML ARTHROMATIC (IV SOLUTION) ×2 IMPLANT
KNEE WRAP E Z 3 GEL PACK (MISCELLANEOUS) ×2 IMPLANT
MANIFOLD NEPTUNE II (INSTRUMENTS) ×2 IMPLANT
NDL SAFETY ECLIPSE 18X1.5 (NEEDLE) ×1 IMPLANT
NEEDLE HYPO 18GX1.5 SHARP (NEEDLE) ×1
PACK ARTHROSCOPY DSU (CUSTOM PROCEDURE TRAY) ×2 IMPLANT
PACK BASIN DAY SURGERY FS (CUSTOM PROCEDURE TRAY) ×2 IMPLANT
PAD ALCOHOL SWAB (MISCELLANEOUS) ×2 IMPLANT
PENCIL BUTTON HOLSTER BLD 10FT (ELECTRODE) IMPLANT
PROBE BIPOLAR ATHRO 135MM 90D (MISCELLANEOUS) IMPLANT
SLEEVE SCD COMPRESS KNEE MED (MISCELLANEOUS) IMPLANT
SYR 3ML 18GX1 1/2 (SYRINGE) IMPLANT
SYR 5ML LL (SYRINGE) ×2 IMPLANT
TOWEL GREEN STERILE FF (TOWEL DISPOSABLE) ×2 IMPLANT
TUBING ARTHRO INFLOW-ONLY STRL (TUBING) ×2 IMPLANT
WATER STERILE IRR 1000ML POUR (IV SOLUTION) ×2 IMPLANT

## 2017-10-15 NOTE — Discharge Instructions (Addendum)
Knee Arthroscopy, Care After Refer to this sheet in the next few weeks. These instructions provide you with information about caring for yourself after your procedure. Your health care provider may also give you more specific instructions. Your treatment has been planned according to current medical practices, but problems sometimes occur. Call your health care provider if you have any problems or questions after your procedure. What can I expect after the procedure? After the procedure, it is common to have:  Soreness.  Pain.  Follow these instructions at home: Bathing  Do not take baths, swim, or use a hot tub until your health care provider approves. Incision care  There are many different ways to close and cover an incision, including stitches, skin glue, and adhesive strips. Follow your health care providers instructions about: ? Incision care. ? Bandage (dressing) changes and removal. ? Incision closure removal.  Check your incision area every day for signs of infection. Watch for: ? Redness, swelling, or pain. ? Fluid, blood, or pus. Activity  Avoid strenuous activities for as long as directed by your health care provider.  Return to your normal activities as directed by your health care provider. Ask your health care provider what activities are safe for you.  Perform range-of-motion exercises only as directed by your health care provider.  Do not lift anything that is heavier than 10 lb (4.5 kg).  Do not drive or operate heavy machinery while taking pain medicine.  If you were given crutches, use them as directed by your health care provider. Managing pain, stiffness, and swelling  If directed, apply ice to the injured area: ? Put ice in a plastic bag. ? Place a towel between your skin and the bag. ? Leave the ice on for 20 minutes, 2-3 times per day.  Raise the injured area above the level of your heart while you are sitting or lying down as directed by your  health care provider. General instructions  Keep all follow-up visits as directed by your health care provider. This is important.  Take medicines only as directed by your health care provider.  Do not use any tobacco products, including cigarettes, chewing tobacco, or electronic cigarettes. If you need help quitting, ask your health care provider.  If you were given compression stockings, wear them as directed by your health care provider. These stockings help prevent blood clots and reduce swelling in your legs. Contact a health care provider if:  You have severe pain with any movement of your knee.  You notice a bad smell coming from the incision or dressing.  You have redness, swelling, or pain at the site of your incision.  You have fluid, blood, or pus coming from your incision. Get help right away if:  You develop a rash.  You have a fever.  You have difficulty breathing or have shortness of breath.  You develop pain in your calves or in the back of your knee.  You develop chest pain.  You develop numbness or tingling in your leg or foot. This information is not intended to replace advice given to you by your health care provider. Make sure you discuss any questions you have with your health care provider. Document Released: 11/09/2004 Document Revised: 09/22/2015 Document Reviewed: 04/18/2014      Post Anesthesia Home Care Instructions  Activity: Get plenty of rest for the remainder of the day. A responsible individual must stay with you for 24 hours following the procedure.  For the next 24 hours,  DO NOT: -Drive a car -Advertising copywriter -Drink alcoholic beverages -Take any medication unless instructed by your physician -Make any legal decisions or sign important papers.  Meals: Start with liquid foods such as gelatin or soup. Progress to regular foods as tolerated. Avoid greasy, spicy, heavy foods. If nausea and/or vomiting occur, drink only clear liquids  until the nausea and/or vomiting subsides. Call your physician if vomiting continues.  Special Instructions/Symptoms: Your throat may feel dry or sore from the anesthesia or the breathing tube placed in your throat during surgery. If this causes discomfort, gargle with warm salt water. The discomfort should disappear within 24 hours.  If you had a scopolamine patch placed behind your ear for the management of post- operative nausea and/or vomiting:  1. The medication in the patch is effective for 72 hours, after which it should be removed.  Wrap patch in a tissue and discard in the trash. Wash hands thoroughly with soap and water. 2. You may remove the patch earlier than 72 hours if you experience unpleasant side effects which may include dry mouth, dizziness or visual disturbances. 3. Avoid touching the patch. Wash your hands with soap and water after contact with the patch.    Risk analyst Patient Education  [employer].

## 2017-10-15 NOTE — Anesthesia Postprocedure Evaluation (Signed)
Anesthesia Post Note  Patient: Carolyn Church  Procedure(s) Performed: ARTHROSCOPY KNEE (Right Knee)     Patient location during evaluation: PACU Anesthesia Type: General Level of consciousness: awake Pain management: pain level controlled Vital Signs Assessment: post-procedure vital signs reviewed and stable Respiratory status: spontaneous breathing Cardiovascular status: stable Anesthetic complications: no    Last Vitals:  Vitals:   10/15/17 1021 10/15/17 1303  BP: 129/81 136/85  Pulse: 87 (!) 101  Resp: 20 13  Temp: 36.4 C 36.4 C  SpO2: 100% 100%    Last Pain:  Vitals:   10/15/17 1303  TempSrc:   PainSc: 8                  Sumedha Munnerlyn

## 2017-10-15 NOTE — Interval H&P Note (Signed)
History and Physical Interval Note:  10/15/2017 12:09 PM  Carolyn Church  has presented today for surgery, with the diagnosis of RIGHT KNEE LOOSE BODY AND LATERAL MENISCUS TEAR  The various methods of treatment have been discussed with the patient and family. After consideration of risks, benefits and other options for treatment, the patient has consented to  Procedure(s): ARTHROSCOPY KNEE (Right) as a surgical intervention .  The patient's history has been reviewed, patient examined, no change in status, stable for surgery.  I have reviewed the patient's chart and labs.  Questions were answered to the patient's satisfaction.     Nestor LewandowskyFrank J Yoko Mcgahee

## 2017-10-15 NOTE — Transfer of Care (Signed)
Immediate Anesthesia Transfer of Care Note  Patient: Carolyn AskewLinda K Church  Procedure(s) Performed: ARTHROSCOPY KNEE (Right Knee)  Patient Location: PACU  Anesthesia Type:General  Level of Consciousness: awake, alert  and oriented  Airway & Oxygen Therapy: Patient Spontanous Breathing and Patient connected to face mask oxygen  Post-op Assessment: Report given to RN and Post -op Vital signs reviewed and stable  Post vital signs: Reviewed and stable  Last Vitals:  Vitals Value Taken Time  BP    Temp    Pulse    Resp    SpO2      Last Pain:  Vitals:   10/15/17 1021  TempSrc: Oral  PainSc: 6          Complications: No apparent anesthesia complications

## 2017-10-15 NOTE — Anesthesia Procedure Notes (Signed)
Procedure Name: LMA Insertion Performed by: Merdis Snodgrass M, CRNA Pre-anesthesia Checklist: Patient identified, Emergency Drugs available, Suction available, Patient being monitored and Timeout performed Patient Re-evaluated:Patient Re-evaluated prior to induction Oxygen Delivery Method: Circle system utilized Preoxygenation: Pre-oxygenation with 100% oxygen Induction Type: IV induction LMA: LMA inserted LMA Size: 3.0 Tube type: Oral Placement Confirmation: positive ETCO2,  CO2 detector and breath sounds checked- equal and bilateral Tube secured with: Tape Dental Injury: Teeth and Oropharynx as per pre-operative assessment        

## 2017-10-15 NOTE — Op Note (Signed)
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 Turner Danielsowan M.D.  Assist: Tomi LikensEric K. Gaylene BrooksPhillips PA-C  (present throughout entire procedure and necessary for timely completion of the procedure) Anes: General LMA  EBL: Minimal  Fluids: 800 cc   Indications: Patient had right knee patellar subluxation/dislocation with residual swelling and pain MRI scan was consistent with a loose body and possible lateral meniscal tear. Pt has failed conservative treatment with anti-inflammatory medicines, physical therapy, and modified activites but did get good temporarily from an intra-articular cortisone injection. Pain has recurred and patient desires elective arthroscopic evaluation and treatment of knee. Risks and benefits of surgery have been discussed and questions answered.  Procedure: Patient identified by arm band and taken to the operating room at the day surgery Center. The appropriate anesthetic monitors were attached, and General LMA anesthesia was induced without difficulty. Lateral post was applied to the table and the lower extremity was prepped and draped in usual sterile fashion from the ankle to the midthigh. Time out procedure was performed. We began the operation by making standard inferior lateral and inferior medial peripatellar portals with a #11 blade allowing introduction of the arthroscope through the inferior lateral portal and the out flow to the inferior medial portal. Pump pressure was set at 100 mmHg and diagnostic arthroscopy  revealed loose body that was found near the inferior apex of the patella donor site was the medial facet this was removed with a 3.5 mm Gator sucker shaver and  was chondral in nature there was no bony fragment.  Medial meniscus had minimal fraying that was lightly debrided grade II chondromalacia the medial tibial plateau was also lightly debrided the cruciate ligaments were intact on the lateral side grade II-III chondromalacia was also debrided to the lateral tibial plateau.  The lateral meniscus was intact gutters were cleared medially and laterally. The knee was irrigated out normal saline solution. A dressing of xerofoam 4 x 4 dressing sponges, web roll and an Ace wrap was applied. The patient was awakened extubated and taken to the recovery without difficulty.    Signed: Nestor LewandowskyFrank J Elasia Furnish, MD

## 2017-10-15 NOTE — Anesthesia Preprocedure Evaluation (Addendum)
Anesthesia Evaluation  Patient identified by MRN, date of birth, ID band Patient awake    Reviewed: Allergy & Precautions, NPO status , Patient's Chart, lab work & pertinent test results  History of Anesthesia Complications (+) PONV  Airway Mallampati: I  TM Distance: >3 FB     Dental   Pulmonary former smoker,    breath sounds clear to auscultation       Cardiovascular negative cardio ROS   Rhythm:Regular Rate:Normal     Neuro/Psych    GI/Hepatic negative GI ROS, Neg liver ROS,   Endo/Other  Hypothyroidism   Renal/GU negative Renal ROS     Musculoskeletal   Abdominal   Peds  Hematology   Anesthesia Other Findings   Reproductive/Obstetrics                            Anesthesia Physical Anesthesia Plan  ASA: II  Anesthesia Plan: General   Post-op Pain Management:    Induction: Intravenous  PONV Risk Score and Plan: 4 or greater and Treatment may vary due to age or medical condition, Ondansetron and Midazolam  Airway Management Planned: LMA  Additional Equipment:   Intra-op Plan:   Post-operative Plan: Extubation in OR  Informed Consent: I have reviewed the patients History and Physical, chart, labs and discussed the procedure including the risks, benefits and alternatives for the proposed anesthesia with the patient or authorized representative who has indicated his/her understanding and acceptance.   Dental advisory given  Plan Discussed with: Anesthesiologist and CRNA  Anesthesia Plan Comments:         Anesthesia Quick Evaluation

## 2017-10-16 ENCOUNTER — Encounter (HOSPITAL_BASED_OUTPATIENT_CLINIC_OR_DEPARTMENT_OTHER): Payer: Self-pay | Admitting: Orthopedic Surgery

## 2020-08-04 ENCOUNTER — Encounter: Payer: Self-pay | Admitting: Sports Medicine

## 2020-08-04 ENCOUNTER — Other Ambulatory Visit: Payer: Self-pay

## 2020-08-04 ENCOUNTER — Ambulatory Visit (INDEPENDENT_AMBULATORY_CARE_PROVIDER_SITE_OTHER): Payer: BC Managed Care – PPO | Admitting: Sports Medicine

## 2020-08-04 DIAGNOSIS — M722 Plantar fascial fibromatosis: Secondary | ICD-10-CM | POA: Diagnosis not present

## 2020-08-04 DIAGNOSIS — M79672 Pain in left foot: Secondary | ICD-10-CM | POA: Diagnosis not present

## 2020-08-04 DIAGNOSIS — M7750 Other enthesopathy of unspecified foot: Secondary | ICD-10-CM

## 2020-08-04 MED ORDER — PREDNISONE 10 MG (21) PO TBPK: ORAL_TABLET | ORAL | 0 refills | Status: AC

## 2020-08-04 NOTE — Progress Notes (Signed)
Subjective: Carolyn Church is a 40 y.o. female patient presents to office with complaint of moderate heel pain to the bottom and the sides of the heel on the left reports that pain also extends to her mid arch states that this has been going on since she had Covid in February and it never went away patient reports that pain started with a migraine and foot pain the migraine went away with medication but the foot pain he has not has been taking ibuprofen with no relief.  Patient denies any other pedal complaints at this time.  Review of systems noncontributory  Patient Active Problem List   Diagnosis Date Noted  . Acute lateral meniscus tear of right knee 10/14/2017  . Abdominal pain     Current Outpatient Medications on File Prior to Visit  Medication Sig Dispense Refill  . ALPRAZolam (XANAX) 1 MG tablet Take 1 mg by mouth daily as needed.    Marland Kitchen amphetamine-dextroamphetamine (ADDERALL) 20 MG tablet Take 20 mg by mouth daily.    . Ascorbic Acid (VITAMIN C) 100 MG tablet Take 100 mg by mouth daily.    . budesonide (PULMICORT) 0.5 MG/2ML nebulizer solution SMARTSIG:2 Ampule(s) Via Nebulizer Twice Daily    . cetirizine (ZYRTEC) 10 MG tablet Take by mouth.    . Cholecalciferol (VITAMIN D3) 3000 units TABS Take by mouth.    . furosemide (LASIX) 20 MG tablet Take 20 mg by mouth daily.    . hydrochlorothiazide (HYDRODIURIL) 25 MG tablet Take 25 mg by mouth daily.    . hydroxychloroquine (PLAQUENIL) 200 MG tablet Take 200 mg by mouth daily.    Marland Kitchen KLOR-CON M20 20 MEQ tablet Take 20 mEq by mouth daily.    Marland Kitchen liothyronine (CYTOMEL) 25 MCG tablet Take by mouth.    . liothyronine (CYTOMEL) 5 MCG tablet Take 5 mcg by mouth 3 (three) times daily.    . Melatonin 10 MG CAPS Take by mouth.    . ondansetron (ZOFRAN) 4 MG tablet Take 4 mg by mouth at bedtime.    Marland Kitchen oxyCODONE-acetaminophen (PERCOCET/ROXICET) 5-325 MG tablet Take 1 tablet by mouth every 6 (six) hours as needed for severe pain. 20 tablet 0  .  pantoprazole (PROTONIX) 40 MG tablet Take 40 mg by mouth daily.    . progesterone (PROMETRIUM) 100 MG capsule Take 100 mg by mouth daily.    . progesterone (PROMETRIUM) 100 MG capsule Take by mouth.    . thyroid (ARMOUR) 120 MG tablet Take 120 mg by mouth daily.    Marland Kitchen VICTOZA 18 MG/3ML SOPN Inject 1.2 mg into the skin daily.     No current facility-administered medications on file prior to visit.    Allergies  Allergen Reactions  . Sulfa Antibiotics Shortness Of Breath    Respiratory issues.  . Vicodin [Hydrocodone-Acetaminophen] Itching    Objective: Physical Exam General: The patient is alert and oriented x3 in no acute distress.  Dermatology: Skin is warm, dry and supple bilateral lower extremities. Nails 1-10 are normal. There is no erythema, edema, no eccymosis, no open lesions present. Integument is otherwise unremarkable.  Vascular: Dorsalis Pedis pulse and Posterior Tibial pulse are 2/4 bilateral. Capillary fill time is immediate to all digits.  Neurological: Grossly intact to light touch bilateral.  Musculoskeletal: Tenderness to palpation at the medial calcaneal tubercale and through the insertion of the plantar fascia on the left extending into the mid arch and also to the perimeter of the heel on the left.  There is  minimal limited ankle joint range of motion on left.  Strength 5/5 in all groups bilateral.   Gait: Unassisted, Antalgic avoid weight on left heel  Assessment and Plan: Problem List Items Addressed This Visit   None   Visit Diagnoses    Tendonitis of ankle or foot    -  Primary   Plantar fasciitis of left foot       Left foot pain          -Complete examination performed.  -Xrays were not able to be performed due to technical issues -Discussed with patient in detail the condition of plantar fasciitis/tendinitis, how this occurs and general treatment options. Explained both conservative and surgical treatments.  -Rx prednisone dose pack to take as  instructed -Recommended good supportive shoes and advised use of OTC inserts -Explained and dispensed to patient daily stretching exercises. -Recommend patient to ice affected area 1-2x daily. -Patient to return to office in 3-4 weeks for follow up or sooner if problems or questions arise.  [name], DPM

## 2020-08-04 NOTE — Patient Instructions (Signed)
Plantar Fasciitis Rehab Ask your health care provider which exercises are safe for you. Do exercises exactly as told by your health care provider and adjust them as directed. It is normal to feel mild stretching, pulling, tightness, or discomfort as you do these exercises. Stop right away if you feel sudden pain or your pain gets worse. Do not begin these exercises until told by your health care provider. Stretching and range-of-motion exercises These exercises warm up your muscles and joints and improve the movement and flexibility of your foot. These exercises also help to relieve pain. Plantar fascia stretch 1. Sit with your left / right leg crossed over your opposite knee. 2. Hold your heel with one hand with that thumb near your arch. With your other hand, hold your toes and gently pull them back toward the top of your foot. You should feel a stretch on the base (bottom) of your toes, or the bottom of your foot (plantar fascia), or both. 3. Hold this stretch for__________ seconds. 4. Slowly release your toes and return to the starting position. Repeat __________ times. Complete this exercise __________ times a day.   Gastrocnemius stretch, standing This exercise is also called a calf (gastroc) stretch. It stretches the muscles in the back of the upper calf. 1. Stand with your hands against a wall. 2. Extend your left / right leg behind you, and bend your front knee slightly. 3. Keeping your heels on the floor, your toes facing forward, and your back knee straight, shift your weight toward the wall. Do not arch your back. You should feel a gentle stretch in your upper calf. 4. Hold this position for __________ seconds. Repeat __________ times. Complete this exercise __________ times a day.   Soleus stretch, standing This exercise is also called a calf (soleus) stretch. It stretches the muscles in the back of the lower calf. 1. Stand with your hands against a wall. 2. Extend your left / right  leg behind you, and bend your front knee slightly. 3. Keeping your heels on the floor and your toes facing forward, bend your back knee and shift your weight slightly over your back leg. You should feel a gentle stretch deep in your lower calf. 4. Hold this position for __________ seconds. Repeat __________ times. Complete this exercise __________ times a day. Gastroc and soleus stretch, standing step This exercise stretches the muscles in the back of the lower leg. These muscles are in the upper calf (gastrocnemius) and the lower calf (soleus). 1. Stand with the ball of your left / right foot on the front of a step. The ball of your foot is on the walking surface, right under your toes. 2. Keep your other foot firmly on the same step. 3. Hold on to the wall or a railing for balance. 4. Slowly lift your other foot, allowing your body weight to press your heel down over the edge of the front of the step. Keep knee straight and unbent. You should feel a stretch in your calf. 5. Hold this position for __________ seconds. 6. Return both feet to the step. 7. Repeat this exercise with a slight bend in your left / right knee. Repeat __________ times with your left / right knee straight and __________ times with your left / right knee bent. Complete this exercise __________ times a day. Balance exercise This exercise builds your balance and strength control of your arch to help take pressure off your plantar fascia. Single leg stand If this exercise   is too easy, you can try it with your eyes closed or while standing on a pillow. 1. Without shoes, stand near a railing or in a doorway. You may hold on to the railing or door frame as needed. 2. Stand on your left / right foot. Keep your big toe down on the floor and lift the arch of your foot. You should feel a stretch across the bottom of your foot and your arch. Do not let your foot roll inward. 3. Hold this position for __________ seconds. Repeat  __________ times. Complete this exercise __________ times a day. This information is not intended to replace advice given to you by your health care provider. Make sure you discuss any questions you have with your health care provider. Document Revised: 02/03/2020 Document Reviewed: 02/03/2020 Else[employer] Patient Education  2021 [employer]  

## 2020-09-08 ENCOUNTER — Encounter: Payer: Self-pay | Admitting: Sports Medicine

## 2020-09-08 ENCOUNTER — Ambulatory Visit (INDEPENDENT_AMBULATORY_CARE_PROVIDER_SITE_OTHER): Payer: BC Managed Care – PPO | Admitting: Sports Medicine

## 2020-09-08 ENCOUNTER — Ambulatory Visit (INDEPENDENT_AMBULATORY_CARE_PROVIDER_SITE_OTHER): Payer: BC Managed Care – PPO

## 2020-09-08 ENCOUNTER — Other Ambulatory Visit: Payer: Self-pay

## 2020-09-08 DIAGNOSIS — M79672 Pain in left foot: Secondary | ICD-10-CM

## 2020-09-08 DIAGNOSIS — M722 Plantar fascial fibromatosis: Secondary | ICD-10-CM

## 2020-09-08 DIAGNOSIS — M7750 Other enthesopathy of unspecified foot: Secondary | ICD-10-CM

## 2020-09-08 MED ORDER — DICLOFENAC SODIUM 75 MG PO TBEC: 75.0000 mg | DELAYED_RELEASE_TABLET | Freq: Two times a day (BID) | ORAL | 0 refills | Status: AC

## 2020-09-08 MED ORDER — TRIAMCINOLONE ACETONIDE 10 MG/ML IJ SUSP
10.0000 mg | Freq: Once | INTRAMUSCULAR | Status: AC
  Administered 2020-09-08: 10 mg

## 2020-09-08 NOTE — Progress Notes (Addendum)
Subjective: Carolyn Church is a 40 y.o. female returns to office for follow up evaluation after Left heel pain.  Patient reports that pain is about the same some days it hurts more than others today at 6 out of 10 while other days it can be as high as 10 out of 10 reports that she has been taking her Tylenol and ibuprofen since she has finished the steroid and using ice. Patient denies any recent changes in medications or new problems since last visit.   Patient Active Problem List   Diagnosis Date Noted  . Acute lateral meniscus tear of right knee 10/14/2017  . Abdominal pain     Current Outpatient Medications on File Prior to Visit  Medication Sig Dispense Refill  . ALPRAZolam (XANAX) 1 MG tablet Take 1 mg by mouth daily as needed.    Marland Kitchen amphetamine-dextroamphetamine (ADDERALL) 20 MG tablet Take 20 mg by mouth daily.    . Ascorbic Acid (VITAMIN C) 100 MG tablet Take 100 mg by mouth daily.    . budesonide (PULMICORT) 0.5 MG/2ML nebulizer solution SMARTSIG:2 Ampule(s) Via Nebulizer Twice Daily    . cetirizine (ZYRTEC) 10 MG tablet Take by mouth.    . Cholecalciferol (VITAMIN D3) 3000 units TABS Take by mouth.    . furosemide (LASIX) 20 MG tablet Take 20 mg by mouth daily.    . hydrochlorothiazide (HYDRODIURIL) 25 MG tablet Take 25 mg by mouth daily.    . hydroxychloroquine (PLAQUENIL) 200 MG tablet Take 200 mg by mouth daily.    Marland Kitchen KLOR-CON M20 20 MEQ tablet Take 20 mEq by mouth daily.    Marland Kitchen liothyronine (CYTOMEL) 25 MCG tablet Take by mouth.    . liothyronine (CYTOMEL) 5 MCG tablet Take 5 mcg by mouth 3 (three) times daily.    . Melatonin 10 MG CAPS Take by mouth.    . ondansetron (ZOFRAN) 4 MG tablet Take 4 mg by mouth at bedtime.    Marland Kitchen oxyCODONE-acetaminophen (PERCOCET/ROXICET) 5-325 MG tablet Take 1 tablet by mouth every 6 (six) hours as needed for severe pain. 20 tablet 0  . pantoprazole (PROTONIX) 40 MG tablet Take 40 mg by mouth daily.    . predniSONE (STERAPRED UNI-PAK 21 TAB) 10  MG (21) TBPK tablet Take as directed 21 tablet 0  . progesterone (PROMETRIUM) 100 MG capsule Take 100 mg by mouth daily.    . progesterone (PROMETRIUM) 100 MG capsule Take by mouth.    . thyroid (ARMOUR) 120 MG tablet Take 120 mg by mouth daily.    Marland Kitchen VICTOZA 18 MG/3ML SOPN Inject 1.2 mg into the skin daily.     No current facility-administered medications on file prior to visit.    Allergies  Allergen Reactions  . Sulfa Antibiotics Shortness Of Breath    Respiratory issues.  . Vicodin [Hydrocodone-Acetaminophen] Itching    Objective:   General:  Alert and oriented x 3, in no acute distress  Dermatology: Skin is warm, dry, and supple bilateral. Nails are within normal limits. There is no lower extremity erythema, no eccymosis, no open lesions present bilateral.   Vascular: Dorsalis Pedis and Posterior Tibial pedal pulses are 2/4 bilateral. + hair growth noted bilateral. Capillary Fill Time is 3 seconds in all digits. No varicosities, No edema bilateral lower extremities.   Neurological: Sensation grossly intact to light touch via light touch bilateral.  Musculoskeletal: There is tenderness to palpation at the medial calcaneal tubercale and through the insertion of the plantar fascia on the Left  foot. No pain with compression to calcaneus or application of tuning fork. There is decreased Ankle joint range of motion bilateral. All other jointsrange of motion  within normal limits bilateral. Strength 5/5 bilateral.   Assessment and Plan: Problem List Items Addressed This Visit   None   Visit Diagnoses    Plantar fasciitis of left foot    -  Primary   Relevant Medications   triamcinolone acetonide (KENALOG) 10 MG/ML injection 10 mg (Completed) (Start on 09/08/2020 12:30 PM)   Other Relevant Orders   DG Foot Complete Left   Tendonitis of ankle or foot       Relevant Orders   DG Foot Complete Left   Left foot pain          -Complete examination performed.  -X-rays reviewed.  Inferior heel spur. No other acute osseous findings. -Re-Discussed with patient in detail the condition of plantar fasciitis, how this  occurs related to the foot type of the patient and general treatment options. - Patient opted for injection today; After oral consent and aseptic prep, injected a mixture containing 1 ml of 1%plain lidocaine, 1 ml 0.5% plain marcaine, 0.5 ml of kenalog 10 and 0.5 ml of dexmethasone phosphate to left heel at area of most pain/trigger point injection. -Dispensed left fascial brace to use as directed -Rx diclofenac to take as instructed -Continue with stretching, icing, good supportive shoes daily -Discussed long term care and reocurrence; will closely monitor; if fails to improve will consider other treatment modalities.  -Patient to return to office in 1 month for follow up or sooner if problems or questions arise.  [name], DPM

## 2020-10-06 ENCOUNTER — Ambulatory Visit (INDEPENDENT_AMBULATORY_CARE_PROVIDER_SITE_OTHER): Payer: BC Managed Care – PPO | Admitting: Sports Medicine

## 2020-10-06 ENCOUNTER — Encounter: Payer: Self-pay | Admitting: Sports Medicine

## 2020-10-06 ENCOUNTER — Other Ambulatory Visit: Payer: Self-pay

## 2020-10-06 DIAGNOSIS — M722 Plantar fascial fibromatosis: Secondary | ICD-10-CM

## 2020-10-06 DIAGNOSIS — M79672 Pain in left foot: Secondary | ICD-10-CM

## 2020-10-06 DIAGNOSIS — M7750 Other enthesopathy of unspecified foot: Secondary | ICD-10-CM

## 2020-10-06 MED ORDER — TRIAMCINOLONE ACETONIDE 10 MG/ML IJ SUSP
10.0000 mg | Freq: Once | INTRAMUSCULAR | Status: AC
  Administered 2020-10-06: 10 mg

## 2020-10-06 NOTE — Progress Notes (Signed)
Subjective: Carolyn Church is a 40 y.o. female returns to office for follow up evaluation after injection for left heel pain.  Patient reports that pain was really good for 2 weeks but now the pain is coming back.  Patient reports that pain is worse after she is on her feet for a really long time and states that the brace seems to be helping.  Patient denies any other pedal complaints at this time.  Patient Active Problem List   Diagnosis Date Noted  . Acute lateral meniscus tear of right knee 10/14/2017  . Abdominal pain     Current Outpatient Medications on File Prior to Visit  Medication Sig Dispense Refill  . ALPRAZolam (XANAX) 1 MG tablet Take 1 mg by mouth daily as needed.    Marland Kitchen amphetamine-dextroamphetamine (ADDERALL) 20 MG tablet Take 20 mg by mouth daily.    . Ascorbic Acid (VITAMIN C) 100 MG tablet Take 100 mg by mouth daily.    . budesonide (PULMICORT) 0.5 MG/2ML nebulizer solution SMARTSIG:2 Ampule(s) Via Nebulizer Twice Daily    . cetirizine (ZYRTEC) 10 MG tablet Take by mouth.    . Cholecalciferol (VITAMIN D3) 3000 units TABS Take by mouth.    . diclofenac (VOLTAREN) 75 MG EC tablet Take 1 tablet (75 mg total) by mouth 2 (two) times daily. 30 tablet 0  . furosemide (LASIX) 20 MG tablet Take 20 mg by mouth daily.    . hydrochlorothiazide (HYDRODIURIL) 25 MG tablet Take 25 mg by mouth daily.    . hydroxychloroquine (PLAQUENIL) 200 MG tablet Take 200 mg by mouth daily.    Marland Kitchen KLOR-CON M20 20 MEQ tablet Take 20 mEq by mouth daily.    Marland Kitchen liothyronine (CYTOMEL) 25 MCG tablet Take by mouth.    . liothyronine (CYTOMEL) 5 MCG tablet Take 5 mcg by mouth 3 (three) times daily.    . Melatonin 10 MG CAPS Take by mouth.    . ondansetron (ZOFRAN) 4 MG tablet Take 4 mg by mouth at bedtime.    Marland Kitchen oxyCODONE-acetaminophen (PERCOCET/ROXICET) 5-325 MG tablet Take 1 tablet by mouth every 6 (six) hours as needed for severe pain. 20 tablet 0  . pantoprazole (PROTONIX) 40 MG tablet Take 40 mg by  mouth daily.    . predniSONE (STERAPRED UNI-PAK 21 TAB) 10 MG (21) TBPK tablet Take as directed 21 tablet 0  . progesterone (PROMETRIUM) 100 MG capsule Take 100 mg by mouth daily.    . progesterone (PROMETRIUM) 100 MG capsule Take by mouth.    . thyroid (ARMOUR) 120 MG tablet Take 120 mg by mouth daily.    Marland Kitchen VICTOZA 18 MG/3ML SOPN Inject 1.2 mg into the skin daily.     No current facility-administered medications on file prior to visit.    Allergies  Allergen Reactions  . Sulfa Antibiotics Shortness Of Breath    Respiratory issues.  . Vicodin [Hydrocodone-Acetaminophen] Itching    Objective:   General:  Alert and oriented x 3, in no acute distress  Dermatology: Skin is warm, dry, and supple bilateral. Nails are within normal limits. There is no lower extremity erythema, no eccymosis, no open lesions present bilateral.   Vascular: Dorsalis Pedis and Posterior Tibial pedal pulses are 2/4 bilateral. + hair growth noted bilateral. Capillary Fill Time is 3 seconds in all digits. No varicosities, No edema bilateral lower extremities.   Neurological: Sensation grossly intact to light touch via light touch bilateral.  Musculoskeletal: There is tenderness to palpation at the medial calcaneal  tubercale and through the insertion of the plantar fascia on the Left foot. No pain with compression to calcaneus or application of tuning fork. There is decreased Ankle joint range of motion bilateral. All other jointsrange of motion  within normal limits bilateral. Strength 5/5 bilateral.   Assessment and Plan: Problem List Items Addressed This Visit   None   Visit Diagnoses    Plantar fasciitis of left foot    -  Primary   Relevant Medications   triamcinolone acetonide (KENALOG) 10 MG/ML injection 10 mg (Completed) (Start on 10/06/2020  1:15 PM)   Left foot pain       Tendonitis of ankle or foot          -Complete examination performed.  -Re-Discussed with patient in detail the condition of  plantar fasciitis, how this  occurs related to the foot type of the patient and general treatment options. - Patient opted for injection today; After oral consent and aseptic prep, injected a mixture containing 1 ml of 1%plain lidocaine, 1 ml 0.5% plain marcaine, 0.5 ml of kenalog 10 and 0.5 ml of dexmethasone phosphate to left heel at area of most pain/trigger point injection.  This is injection #2 to the area -Dispensed left night splint to use as directed -Continue with diclofenac to take as instructed until completed -Continue with stretching, icing, good supportive shoes with fascial brace daily -Discussed long term care and reocurrence; will closely monitor; if fails to improve will consider other treatment modalities.  -Patient to return to office in 1 month for follow up or sooner if problems or questions arise.  Carolyn Church, DPM

## 2020-11-03 ENCOUNTER — Other Ambulatory Visit: Payer: Self-pay

## 2020-11-03 ENCOUNTER — Encounter: Payer: Self-pay | Admitting: Sports Medicine

## 2020-11-03 ENCOUNTER — Ambulatory Visit (INDEPENDENT_AMBULATORY_CARE_PROVIDER_SITE_OTHER): Payer: BC Managed Care – PPO | Admitting: Sports Medicine

## 2020-11-03 DIAGNOSIS — M79672 Pain in left foot: Secondary | ICD-10-CM

## 2020-11-03 DIAGNOSIS — M7750 Other enthesopathy of unspecified foot: Secondary | ICD-10-CM

## 2020-11-03 DIAGNOSIS — M722 Plantar fascial fibromatosis: Secondary | ICD-10-CM | POA: Diagnosis not present

## 2020-11-03 MED ORDER — TRIAMCINOLONE ACETONIDE 10 MG/ML IJ SUSP: 10.0000 mg | Freq: Once | INTRAMUSCULAR | Status: AC

## 2020-11-03 NOTE — Progress Notes (Signed)
Subjective: Carolyn Church is a 40 y.o. female returns to office for follow up evaluation after injection for left heel pain.  Patient reports that injection helped for several weeks's but now the pain is coming back.  Patient denies any other pedal complaints at this time.  Patient Active Problem List   Diagnosis Date Noted   Acute lateral meniscus tear of right knee 10/14/2017   Abdominal pain     Current Outpatient Medications on File Prior to Visit  Medication Sig Dispense Refill   ALPRAZolam (XANAX) 1 MG tablet Take 1 mg by mouth daily as needed.     amphetamine-dextroamphetamine (ADDERALL) 20 MG tablet Take 20 mg by mouth daily.     Ascorbic Acid (VITAMIN C) 100 MG tablet Take 100 mg by mouth daily.     budesonide (PULMICORT) 0.5 MG/2ML nebulizer solution SMARTSIG:2 Ampule(s) Via Nebulizer Twice Daily     cetirizine (ZYRTEC) 10 MG tablet Take by mouth.     Cholecalciferol (VITAMIN D3) 3000 units TABS Take by mouth.     colchicine 0.6 MG tablet Take 0.6 mg by mouth 2 (two) times daily.     diclofenac (VOLTAREN) 75 MG EC tablet Take 1 tablet (75 mg total) by mouth 2 (two) times daily. 30 tablet 0   furosemide (LASIX) 20 MG tablet Take 20 mg by mouth daily.     hydrochlorothiazide (HYDRODIURIL) 25 MG tablet Take 25 mg by mouth daily.     hydroxychloroquine (PLAQUENIL) 200 MG tablet Take 200 mg by mouth daily.     KLOR-CON M20 20 MEQ tablet Take 20 mEq by mouth daily.     liothyronine (CYTOMEL) 25 MCG tablet Take by mouth.     liothyronine (CYTOMEL) 5 MCG tablet Take 5 mcg by mouth 3 (three) times daily.     Melatonin 10 MG CAPS Take by mouth.     ondansetron (ZOFRAN) 4 MG tablet Take 4 mg by mouth at bedtime.     oxyCODONE-acetaminophen (PERCOCET/ROXICET) 5-325 MG tablet Take 1 tablet by mouth every 6 (six) hours as needed for severe pain. 20 tablet 0   pantoprazole (PROTONIX) 40 MG tablet Take 40 mg by mouth daily.     predniSONE (STERAPRED UNI-PAK 21 TAB) 10 MG (21) TBPK tablet  Take as directed 21 tablet 0   progesterone (PROMETRIUM) 100 MG capsule Take 100 mg by mouth daily.     progesterone (PROMETRIUM) 100 MG capsule Take by mouth.     thyroid (ARMOUR) 120 MG tablet Take 120 mg by mouth daily.     VICTOZA 18 MG/3ML SOPN Inject 1.2 mg into the skin daily.     No current facility-administered medications on file prior to visit.    Allergies  Allergen Reactions   Sulfa Antibiotics Shortness Of Breath    Respiratory issues.   Vicodin [Hydrocodone-Acetaminophen] Itching    Objective:   General:  Alert and oriented x 3, in no acute distress  Dermatology: Skin is warm, dry, and supple bilateral. Nails are within normal limits. There is no lower extremity erythema, no eccymosis, no open lesions present bilateral.   Vascular: Dorsalis Pedis and Posterior Tibial pedal pulses are 2/4 bilateral. + hair growth noted bilateral. Capillary Fill Time is 3 seconds in all digits. No varicosities, No edema bilateral lower extremities.   Neurological: Sensation grossly intact to light touch via light touch bilateral.  Musculoskeletal: There is tenderness to palpation at the medial calcaneal tubercale and through the insertion of the plantar fascia on the Left  foot. No pain with compression to calcaneus or application of tuning fork. There is decreased Ankle joint range of motion bilateral. All other jointsrange of motion  within normal limits bilateral. Strength 5/5 bilateral.   Assessment and Plan: Problem List Items Addressed This Visit   None Visit Diagnoses     Plantar fasciitis of left foot    -  Primary   Left foot pain       Tendonitis of ankle or foot           -Complete examination performed.  -Re-Discussed with patient in detail the condition of plantar fasciitis, how this  occurs related to the foot type of the patient and general treatment options. - Patient opted for injection today; After oral consent and aseptic prep, injected a mixture containing 1  ml of 1%plain lidocaine, 1 ml 0.5% plain marcaine, 0.5 ml of kenalog 10 and 0.5 ml of dexmethasone phosphate to left heel at area of most pain/trigger point injection.  This is injection #3 to the area -Continue with eft night splint to use as directed -Continue with stretching, icing, good supportive shoes with fascial brace daily -Discussed long term care and reocurrence; will closely monitor; if fails to improve will consider other treatment modalities.  -Patient to return to office in 1 month for follow up or virtual or sooner if problems or questions arise.  Advised patient if symptoms are no better may benefit from a discussion about starting shockwave therapy.  [name], DPM

## 2020-11-24 ENCOUNTER — Encounter: Payer: Self-pay | Admitting: Sports Medicine

## 2020-11-24 ENCOUNTER — Telehealth (INDEPENDENT_AMBULATORY_CARE_PROVIDER_SITE_OTHER): Payer: BC Managed Care – PPO | Admitting: Sports Medicine

## 2020-11-24 DIAGNOSIS — M722 Plantar fascial fibromatosis: Secondary | ICD-10-CM

## 2020-11-24 DIAGNOSIS — M7750 Other enthesopathy of unspecified foot: Secondary | ICD-10-CM

## 2020-11-24 DIAGNOSIS — M79672 Pain in left foot: Secondary | ICD-10-CM

## 2020-11-24 NOTE — Progress Notes (Signed)
Virtual Visit via Telephone Note  I connected with Carolyn Church on 11/24/20 at  8:00 AM EDT by telephone and verified that I am speaking with the correct person using two identifiers.  Location: Patient: Carolyn Church, home Provider: Landis Martins, DPM, [facility] [place] office   I discussed the limitations, risks, security and privacy concerns of performing an evaluation and management service by telephone and the availability of in person appointments. I also discussed with the patient that there may be a patient responsible charge related to this service. The patient expressed understanding and agreed to proceed.   History of Present Illness: 40 year old female patient met via telemedicine telephone call visit to discuss left foot pain.  Patient reports that she still has pain states that if she is on her foot a lot pain is worse at end of day.  Patient is still using brace icing resting and states that the third shot helped a little better but still has discomfort.   Observations/Objective: Physical exam unable to be performed due to telephone nature of visit  Assessment and Plan: Problem List Items Addressed This Visit   None Visit Diagnoses     Plantar fasciitis of left foot    -  Primary   Left foot pain       Tendonitis of ankle or foot          Discussed treatment options for continued left foot pain related to fasciitis and tendinitis Patient elects at this time to try shockwave treatment educated patient on this treatment and alternatives Advised patient meanwhile she may continue with gentle stretching resting icing elevating ibuprofen until she starts shockwave therapy once she starts shockwave therapy advised her to discontinue icing and ibuprofen and to use Tylenol and to rest on the day of the treatment.  Advised patient that she may need a cam boot if there is pain after her shockwave treatment.  Follow Up Instructions: As scheduled  for shockwave treatments in [place]   I discussed the assessment and treatment plan with the patient. The patient was provided an opportunity to ask questions and all were answered. The patient agreed with the plan and demonstrated an understanding of the instructions.   The patient was advised to call back or seek an in-person evaluation if the symptoms worsen or if the condition fails to improve as anticipated.  I provided 10 minutes of non-face-to-face time during this encounter.   Landis Martins, DPM

## 2020-12-22 ENCOUNTER — Other Ambulatory Visit: Payer: BC Managed Care – PPO

## 2021-07-18 DIAGNOSIS — M9903 Segmental and somatic dysfunction of lumbar region: Secondary | ICD-10-CM | POA: Diagnosis not present

## 2021-07-18 DIAGNOSIS — M9901 Segmental and somatic dysfunction of cervical region: Secondary | ICD-10-CM | POA: Diagnosis not present

## 2021-07-18 DIAGNOSIS — M9905 Segmental and somatic dysfunction of pelvic region: Secondary | ICD-10-CM | POA: Diagnosis not present

## 2021-07-18 DIAGNOSIS — M531 Cervicobrachial syndrome: Secondary | ICD-10-CM | POA: Diagnosis not present

## 2021-07-18 DIAGNOSIS — M9902 Segmental and somatic dysfunction of thoracic region: Secondary | ICD-10-CM | POA: Diagnosis not present

## 2021-07-18 DIAGNOSIS — M62830 Muscle spasm of back: Secondary | ICD-10-CM | POA: Diagnosis not present

## 2021-08-22 DIAGNOSIS — M9901 Segmental and somatic dysfunction of cervical region: Secondary | ICD-10-CM | POA: Diagnosis not present

## 2021-08-22 DIAGNOSIS — M9902 Segmental and somatic dysfunction of thoracic region: Secondary | ICD-10-CM | POA: Diagnosis not present

## 2021-08-22 DIAGNOSIS — M9903 Segmental and somatic dysfunction of lumbar region: Secondary | ICD-10-CM | POA: Diagnosis not present

## 2021-08-22 DIAGNOSIS — M531 Cervicobrachial syndrome: Secondary | ICD-10-CM | POA: Diagnosis not present

## 2021-09-26 DIAGNOSIS — M9901 Segmental and somatic dysfunction of cervical region: Secondary | ICD-10-CM | POA: Diagnosis not present

## 2021-09-26 DIAGNOSIS — M9902 Segmental and somatic dysfunction of thoracic region: Secondary | ICD-10-CM | POA: Diagnosis not present

## 2021-09-26 DIAGNOSIS — M9903 Segmental and somatic dysfunction of lumbar region: Secondary | ICD-10-CM | POA: Diagnosis not present

## 2021-09-26 DIAGNOSIS — M531 Cervicobrachial syndrome: Secondary | ICD-10-CM | POA: Diagnosis not present

## 2021-10-15 DIAGNOSIS — E039 Hypothyroidism, unspecified: Secondary | ICD-10-CM | POA: Diagnosis not present

## 2021-10-15 DIAGNOSIS — E559 Vitamin D deficiency, unspecified: Secondary | ICD-10-CM | POA: Diagnosis not present

## 2021-10-15 DIAGNOSIS — R799 Abnormal finding of blood chemistry, unspecified: Secondary | ICD-10-CM | POA: Diagnosis not present

## 2021-10-15 DIAGNOSIS — N951 Menopausal and female climacteric states: Secondary | ICD-10-CM | POA: Diagnosis not present

## 2021-10-29 DIAGNOSIS — M9902 Segmental and somatic dysfunction of thoracic region: Secondary | ICD-10-CM | POA: Diagnosis not present

## 2021-10-29 DIAGNOSIS — M9901 Segmental and somatic dysfunction of cervical region: Secondary | ICD-10-CM | POA: Diagnosis not present

## 2021-10-29 DIAGNOSIS — M9903 Segmental and somatic dysfunction of lumbar region: Secondary | ICD-10-CM | POA: Diagnosis not present

## 2021-10-29 DIAGNOSIS — M531 Cervicobrachial syndrome: Secondary | ICD-10-CM | POA: Diagnosis not present

## 2021-11-23 DIAGNOSIS — R0981 Nasal congestion: Secondary | ICD-10-CM | POA: Diagnosis not present

## 2021-11-23 DIAGNOSIS — H60552 Acute reactive otitis externa, left ear: Secondary | ICD-10-CM | POA: Diagnosis not present

## 2021-11-23 DIAGNOSIS — J0100 Acute maxillary sinusitis, unspecified: Secondary | ICD-10-CM | POA: Diagnosis not present

## 2022-01-08 DIAGNOSIS — M9901 Segmental and somatic dysfunction of cervical region: Secondary | ICD-10-CM | POA: Diagnosis not present

## 2022-01-08 DIAGNOSIS — M9903 Segmental and somatic dysfunction of lumbar region: Secondary | ICD-10-CM | POA: Diagnosis not present

## 2022-01-08 DIAGNOSIS — M9902 Segmental and somatic dysfunction of thoracic region: Secondary | ICD-10-CM | POA: Diagnosis not present

## 2022-01-08 DIAGNOSIS — M531 Cervicobrachial syndrome: Secondary | ICD-10-CM | POA: Diagnosis not present

## 2022-01-22 DIAGNOSIS — R0981 Nasal congestion: Secondary | ICD-10-CM | POA: Diagnosis not present

## 2022-01-22 DIAGNOSIS — H9209 Otalgia, unspecified ear: Secondary | ICD-10-CM | POA: Diagnosis not present

## 2022-01-22 DIAGNOSIS — R051 Acute cough: Secondary | ICD-10-CM | POA: Diagnosis not present

## 2022-01-22 DIAGNOSIS — R509 Fever, unspecified: Secondary | ICD-10-CM | POA: Diagnosis not present

## 2022-03-27 DIAGNOSIS — Z79890 Hormone replacement therapy (postmenopausal): Secondary | ICD-10-CM | POA: Diagnosis not present

## 2022-04-03 DIAGNOSIS — R059 Cough, unspecified: Secondary | ICD-10-CM | POA: Diagnosis not present

## 2022-04-03 DIAGNOSIS — R0981 Nasal congestion: Secondary | ICD-10-CM | POA: Diagnosis not present

## 2022-04-03 DIAGNOSIS — J209 Acute bronchitis, unspecified: Secondary | ICD-10-CM | POA: Diagnosis not present

## 2022-04-03 DIAGNOSIS — J019 Acute sinusitis, unspecified: Secondary | ICD-10-CM | POA: Diagnosis not present

## 2022-04-04 DIAGNOSIS — J019 Acute sinusitis, unspecified: Secondary | ICD-10-CM | POA: Diagnosis not present

## 2022-04-04 DIAGNOSIS — J209 Acute bronchitis, unspecified: Secondary | ICD-10-CM | POA: Diagnosis not present

## 2022-04-04 DIAGNOSIS — R059 Cough, unspecified: Secondary | ICD-10-CM | POA: Diagnosis not present

## 2022-04-17 DIAGNOSIS — M9902 Segmental and somatic dysfunction of thoracic region: Secondary | ICD-10-CM | POA: Diagnosis not present

## 2022-04-17 DIAGNOSIS — Z01419 Encounter for gynecological examination (general) (routine) without abnormal findings: Secondary | ICD-10-CM | POA: Diagnosis not present

## 2022-04-17 DIAGNOSIS — M9903 Segmental and somatic dysfunction of lumbar region: Secondary | ICD-10-CM | POA: Diagnosis not present

## 2022-04-17 DIAGNOSIS — M9905 Segmental and somatic dysfunction of pelvic region: Secondary | ICD-10-CM | POA: Diagnosis not present

## 2022-04-17 DIAGNOSIS — R350 Frequency of micturition: Secondary | ICD-10-CM | POA: Diagnosis not present

## 2022-05-23 DIAGNOSIS — M9902 Segmental and somatic dysfunction of thoracic region: Secondary | ICD-10-CM | POA: Diagnosis not present

## 2022-05-23 DIAGNOSIS — M9903 Segmental and somatic dysfunction of lumbar region: Secondary | ICD-10-CM | POA: Diagnosis not present

## 2022-05-23 DIAGNOSIS — M9905 Segmental and somatic dysfunction of pelvic region: Secondary | ICD-10-CM | POA: Diagnosis not present

## 2022-10-04 DIAGNOSIS — F419 Anxiety disorder, unspecified: Secondary | ICD-10-CM | POA: Diagnosis not present

## 2022-10-04 DIAGNOSIS — I10 Essential (primary) hypertension: Secondary | ICD-10-CM | POA: Diagnosis not present

## 2022-10-04 DIAGNOSIS — R768 Other specified abnormal immunological findings in serum: Secondary | ICD-10-CM | POA: Diagnosis not present

## 2022-10-04 DIAGNOSIS — E039 Hypothyroidism, unspecified: Secondary | ICD-10-CM | POA: Diagnosis not present

## 2022-10-16 DIAGNOSIS — S39012A Strain of muscle, fascia and tendon of lower back, initial encounter: Secondary | ICD-10-CM | POA: Diagnosis not present

## 2022-10-22 DIAGNOSIS — M9905 Segmental and somatic dysfunction of pelvic region: Secondary | ICD-10-CM | POA: Diagnosis not present

## 2022-10-22 DIAGNOSIS — M5386 Other specified dorsopathies, lumbar region: Secondary | ICD-10-CM | POA: Diagnosis not present

## 2022-10-22 DIAGNOSIS — M9901 Segmental and somatic dysfunction of cervical region: Secondary | ICD-10-CM | POA: Diagnosis not present

## 2022-10-22 DIAGNOSIS — M9903 Segmental and somatic dysfunction of lumbar region: Secondary | ICD-10-CM | POA: Diagnosis not present

## 2022-10-22 DIAGNOSIS — M9902 Segmental and somatic dysfunction of thoracic region: Secondary | ICD-10-CM | POA: Diagnosis not present

## 2022-11-01 DIAGNOSIS — M47896 Other spondylosis, lumbar region: Secondary | ICD-10-CM | POA: Diagnosis not present

## 2022-11-01 DIAGNOSIS — M791 Myalgia, unspecified site: Secondary | ICD-10-CM | POA: Diagnosis not present

## 2022-11-04 DIAGNOSIS — M5386 Other specified dorsopathies, lumbar region: Secondary | ICD-10-CM | POA: Diagnosis not present

## 2022-11-04 DIAGNOSIS — M9902 Segmental and somatic dysfunction of thoracic region: Secondary | ICD-10-CM | POA: Diagnosis not present

## 2022-11-04 DIAGNOSIS — M9903 Segmental and somatic dysfunction of lumbar region: Secondary | ICD-10-CM | POA: Diagnosis not present

## 2022-11-04 DIAGNOSIS — M9905 Segmental and somatic dysfunction of pelvic region: Secondary | ICD-10-CM | POA: Diagnosis not present

## 2022-12-08 DIAGNOSIS — R0981 Nasal congestion: Secondary | ICD-10-CM | POA: Diagnosis not present

## 2022-12-08 DIAGNOSIS — R051 Acute cough: Secondary | ICD-10-CM | POA: Diagnosis not present

## 2022-12-08 DIAGNOSIS — R509 Fever, unspecified: Secondary | ICD-10-CM | POA: Diagnosis not present

## 2022-12-08 DIAGNOSIS — J019 Acute sinusitis, unspecified: Secondary | ICD-10-CM | POA: Diagnosis not present

## 2022-12-10 DIAGNOSIS — M9902 Segmental and somatic dysfunction of thoracic region: Secondary | ICD-10-CM | POA: Diagnosis not present

## 2022-12-10 DIAGNOSIS — M9905 Segmental and somatic dysfunction of pelvic region: Secondary | ICD-10-CM | POA: Diagnosis not present

## 2022-12-10 DIAGNOSIS — M9903 Segmental and somatic dysfunction of lumbar region: Secondary | ICD-10-CM | POA: Diagnosis not present

## 2022-12-10 DIAGNOSIS — M5386 Other specified dorsopathies, lumbar region: Secondary | ICD-10-CM | POA: Diagnosis not present

## 2023-02-02 DIAGNOSIS — H7291 Unspecified perforation of tympanic membrane, right ear: Secondary | ICD-10-CM | POA: Diagnosis not present

## 2023-02-02 DIAGNOSIS — H6691 Otitis media, unspecified, right ear: Secondary | ICD-10-CM | POA: Diagnosis not present

## 2023-02-17 DIAGNOSIS — M9902 Segmental and somatic dysfunction of thoracic region: Secondary | ICD-10-CM | POA: Diagnosis not present

## 2023-02-17 DIAGNOSIS — M5386 Other specified dorsopathies, lumbar region: Secondary | ICD-10-CM | POA: Diagnosis not present

## 2023-02-17 DIAGNOSIS — M9905 Segmental and somatic dysfunction of pelvic region: Secondary | ICD-10-CM | POA: Diagnosis not present

## 2023-02-17 DIAGNOSIS — M9903 Segmental and somatic dysfunction of lumbar region: Secondary | ICD-10-CM | POA: Diagnosis not present

## 2023-03-19 DIAGNOSIS — M9905 Segmental and somatic dysfunction of pelvic region: Secondary | ICD-10-CM | POA: Diagnosis not present

## 2023-03-19 DIAGNOSIS — M9903 Segmental and somatic dysfunction of lumbar region: Secondary | ICD-10-CM | POA: Diagnosis not present

## 2023-03-19 DIAGNOSIS — M5386 Other specified dorsopathies, lumbar region: Secondary | ICD-10-CM | POA: Diagnosis not present

## 2023-03-19 DIAGNOSIS — M9902 Segmental and somatic dysfunction of thoracic region: Secondary | ICD-10-CM | POA: Diagnosis not present

## 2023-04-08 DIAGNOSIS — R059 Cough, unspecified: Secondary | ICD-10-CM | POA: Diagnosis not present

## 2023-04-08 DIAGNOSIS — R0981 Nasal congestion: Secondary | ICD-10-CM | POA: Diagnosis not present

## 2023-04-08 DIAGNOSIS — M791 Myalgia, unspecified site: Secondary | ICD-10-CM | POA: Diagnosis not present

## 2023-05-05 DIAGNOSIS — M5386 Other specified dorsopathies, lumbar region: Secondary | ICD-10-CM | POA: Diagnosis not present

## 2023-05-05 DIAGNOSIS — M9902 Segmental and somatic dysfunction of thoracic region: Secondary | ICD-10-CM | POA: Diagnosis not present

## 2023-05-05 DIAGNOSIS — M9905 Segmental and somatic dysfunction of pelvic region: Secondary | ICD-10-CM | POA: Diagnosis not present

## 2023-05-05 DIAGNOSIS — M9903 Segmental and somatic dysfunction of lumbar region: Secondary | ICD-10-CM | POA: Diagnosis not present

## 2023-05-23 DIAGNOSIS — F419 Anxiety disorder, unspecified: Secondary | ICD-10-CM | POA: Diagnosis not present

## 2023-05-23 DIAGNOSIS — E039 Hypothyroidism, unspecified: Secondary | ICD-10-CM | POA: Diagnosis not present

## 2023-05-23 DIAGNOSIS — I10 Essential (primary) hypertension: Secondary | ICD-10-CM | POA: Diagnosis not present

## 2023-05-23 DIAGNOSIS — R768 Other specified abnormal immunological findings in serum: Secondary | ICD-10-CM | POA: Diagnosis not present

## 2023-06-23 DIAGNOSIS — M9905 Segmental and somatic dysfunction of pelvic region: Secondary | ICD-10-CM | POA: Diagnosis not present

## 2023-06-23 DIAGNOSIS — M9902 Segmental and somatic dysfunction of thoracic region: Secondary | ICD-10-CM | POA: Diagnosis not present

## 2023-06-23 DIAGNOSIS — M9903 Segmental and somatic dysfunction of lumbar region: Secondary | ICD-10-CM | POA: Diagnosis not present

## 2023-06-23 DIAGNOSIS — M5386 Other specified dorsopathies, lumbar region: Secondary | ICD-10-CM | POA: Diagnosis not present

## 2023-09-09 DIAGNOSIS — M9903 Segmental and somatic dysfunction of lumbar region: Secondary | ICD-10-CM | POA: Diagnosis not present

## 2023-09-09 DIAGNOSIS — M5386 Other specified dorsopathies, lumbar region: Secondary | ICD-10-CM | POA: Diagnosis not present

## 2023-09-09 DIAGNOSIS — M9905 Segmental and somatic dysfunction of pelvic region: Secondary | ICD-10-CM | POA: Diagnosis not present

## 2023-09-09 DIAGNOSIS — M9902 Segmental and somatic dysfunction of thoracic region: Secondary | ICD-10-CM | POA: Diagnosis not present

## 2023-10-21 DIAGNOSIS — M9902 Segmental and somatic dysfunction of thoracic region: Secondary | ICD-10-CM | POA: Diagnosis not present

## 2023-10-21 DIAGNOSIS — M5386 Other specified dorsopathies, lumbar region: Secondary | ICD-10-CM | POA: Diagnosis not present

## 2023-10-21 DIAGNOSIS — M9905 Segmental and somatic dysfunction of pelvic region: Secondary | ICD-10-CM | POA: Diagnosis not present

## 2023-10-21 DIAGNOSIS — M9903 Segmental and somatic dysfunction of lumbar region: Secondary | ICD-10-CM | POA: Diagnosis not present

## 2023-11-18 DIAGNOSIS — M9903 Segmental and somatic dysfunction of lumbar region: Secondary | ICD-10-CM | POA: Diagnosis not present

## 2023-11-18 DIAGNOSIS — M9902 Segmental and somatic dysfunction of thoracic region: Secondary | ICD-10-CM | POA: Diagnosis not present

## 2023-11-18 DIAGNOSIS — M5386 Other specified dorsopathies, lumbar region: Secondary | ICD-10-CM | POA: Diagnosis not present

## 2023-11-18 DIAGNOSIS — M9905 Segmental and somatic dysfunction of pelvic region: Secondary | ICD-10-CM | POA: Diagnosis not present

## 2024-01-25 DIAGNOSIS — J209 Acute bronchitis, unspecified: Secondary | ICD-10-CM | POA: Diagnosis not present

## 2024-01-25 DIAGNOSIS — R051 Acute cough: Secondary | ICD-10-CM | POA: Diagnosis not present

## 2024-01-25 DIAGNOSIS — R0981 Nasal congestion: Secondary | ICD-10-CM | POA: Diagnosis not present

## 2024-01-25 DIAGNOSIS — J040 Acute laryngitis: Secondary | ICD-10-CM | POA: Diagnosis not present

## 2024-02-10 DIAGNOSIS — M9902 Segmental and somatic dysfunction of thoracic region: Secondary | ICD-10-CM | POA: Diagnosis not present

## 2024-02-10 DIAGNOSIS — M5386 Other specified dorsopathies, lumbar region: Secondary | ICD-10-CM | POA: Diagnosis not present

## 2024-02-10 DIAGNOSIS — M9903 Segmental and somatic dysfunction of lumbar region: Secondary | ICD-10-CM | POA: Diagnosis not present

## 2024-02-10 DIAGNOSIS — M9905 Segmental and somatic dysfunction of pelvic region: Secondary | ICD-10-CM | POA: Diagnosis not present

## 2024-03-23 DIAGNOSIS — M9903 Segmental and somatic dysfunction of lumbar region: Secondary | ICD-10-CM | POA: Diagnosis not present

## 2024-03-23 DIAGNOSIS — M9902 Segmental and somatic dysfunction of thoracic region: Secondary | ICD-10-CM | POA: Diagnosis not present

## 2024-03-23 DIAGNOSIS — M5386 Other specified dorsopathies, lumbar region: Secondary | ICD-10-CM | POA: Diagnosis not present

## 2024-03-23 DIAGNOSIS — M9905 Segmental and somatic dysfunction of pelvic region: Secondary | ICD-10-CM | POA: Diagnosis not present

## 2024-04-20 DIAGNOSIS — M9902 Segmental and somatic dysfunction of thoracic region: Secondary | ICD-10-CM | POA: Diagnosis not present

## 2024-04-20 DIAGNOSIS — M9905 Segmental and somatic dysfunction of pelvic region: Secondary | ICD-10-CM | POA: Diagnosis not present

## 2024-04-20 DIAGNOSIS — M5386 Other specified dorsopathies, lumbar region: Secondary | ICD-10-CM | POA: Diagnosis not present

## 2024-04-20 DIAGNOSIS — M9903 Segmental and somatic dysfunction of lumbar region: Secondary | ICD-10-CM | POA: Diagnosis not present
